# Patient Record
Sex: Male | Born: 1960
Health system: Southern US, Community
[De-identification: ages and names within clinical notes are randomized; demographics above are authoritative.]

## PROBLEM LIST (undated history)

## (undated) DIAGNOSIS — Z87442 Personal history of urinary calculi: Secondary | ICD-10-CM

## (undated) DIAGNOSIS — E119 Type 2 diabetes mellitus without complications: Secondary | ICD-10-CM

## (undated) HISTORY — PX: OTHER SURGICAL HISTORY: SHX169

## (undated) HISTORY — PX: KIDNEY STONE SURGERY: SHX686

## (undated) HISTORY — PX: TOOTH EXTRACTION: SUR596

---

## 1998-10-24 ENCOUNTER — Emergency Department (HOSPITAL_COMMUNITY): Admission: EM | Admit: 1998-10-24 | Discharge: 1998-10-24 | Payer: Self-pay | Admitting: Emergency Medicine

## 1998-10-24 ENCOUNTER — Encounter: Payer: Self-pay | Admitting: Emergency Medicine

## 1998-11-11 ENCOUNTER — Encounter: Admission: RE | Admit: 1998-11-11 | Discharge: 1998-11-11 | Payer: Self-pay | Admitting: Internal Medicine

## 1999-04-21 ENCOUNTER — Encounter: Admission: RE | Admit: 1999-04-21 | Discharge: 1999-04-21 | Payer: Self-pay | Admitting: Hematology and Oncology

## 1999-07-23 ENCOUNTER — Encounter: Admission: RE | Admit: 1999-07-23 | Discharge: 1999-07-23 | Payer: Self-pay | Admitting: Internal Medicine

## 1999-07-23 ENCOUNTER — Encounter: Payer: Self-pay | Admitting: Internal Medicine

## 1999-07-23 ENCOUNTER — Ambulatory Visit (HOSPITAL_COMMUNITY): Admission: RE | Admit: 1999-07-23 | Discharge: 1999-07-23 | Payer: Self-pay | Admitting: Internal Medicine

## 1999-09-08 ENCOUNTER — Encounter: Admission: RE | Admit: 1999-09-08 | Discharge: 1999-09-08 | Payer: Self-pay | Admitting: Hematology and Oncology

## 1999-09-11 ENCOUNTER — Encounter: Admission: RE | Admit: 1999-09-11 | Discharge: 1999-09-11 | Payer: Self-pay | Admitting: Internal Medicine

## 1999-09-11 ENCOUNTER — Encounter: Payer: Self-pay | Admitting: Internal Medicine

## 1999-09-11 ENCOUNTER — Ambulatory Visit (HOSPITAL_COMMUNITY): Admission: RE | Admit: 1999-09-11 | Discharge: 1999-09-11 | Payer: Self-pay | Admitting: Internal Medicine

## 1999-11-09 ENCOUNTER — Emergency Department (HOSPITAL_COMMUNITY): Admission: EM | Admit: 1999-11-09 | Discharge: 1999-11-09 | Payer: Self-pay | Admitting: Emergency Medicine

## 1999-11-10 ENCOUNTER — Encounter: Payer: Self-pay | Admitting: Emergency Medicine

## 2000-06-02 ENCOUNTER — Emergency Department (HOSPITAL_COMMUNITY): Admission: EM | Admit: 2000-06-02 | Discharge: 2000-06-02 | Payer: Self-pay | Admitting: Emergency Medicine

## 2000-06-02 ENCOUNTER — Encounter: Payer: Self-pay | Admitting: Emergency Medicine

## 2002-04-13 ENCOUNTER — Encounter: Admission: RE | Admit: 2002-04-13 | Discharge: 2002-04-13 | Payer: Self-pay | Admitting: Internal Medicine

## 2002-04-13 ENCOUNTER — Ambulatory Visit (HOSPITAL_COMMUNITY): Admission: RE | Admit: 2002-04-13 | Discharge: 2002-04-13 | Payer: Self-pay | Admitting: Internal Medicine

## 2002-04-27 ENCOUNTER — Encounter: Admission: RE | Admit: 2002-04-27 | Discharge: 2002-04-27 | Payer: Self-pay | Admitting: Internal Medicine

## 2002-06-21 ENCOUNTER — Emergency Department (HOSPITAL_COMMUNITY): Admission: EM | Admit: 2002-06-21 | Discharge: 2002-06-21 | Payer: Self-pay | Admitting: Emergency Medicine

## 2002-06-21 ENCOUNTER — Encounter: Payer: Self-pay | Admitting: Emergency Medicine

## 2002-11-05 ENCOUNTER — Inpatient Hospital Stay (HOSPITAL_COMMUNITY): Admission: AD | Admit: 2002-11-05 | Discharge: 2002-11-08 | Payer: Self-pay | Admitting: Internal Medicine

## 2002-11-05 ENCOUNTER — Encounter: Admission: RE | Admit: 2002-11-05 | Discharge: 2002-11-05 | Payer: Self-pay | Admitting: Internal Medicine

## 2002-11-07 ENCOUNTER — Encounter: Payer: Self-pay | Admitting: Cardiology

## 2003-03-26 ENCOUNTER — Encounter: Payer: Self-pay | Admitting: Emergency Medicine

## 2003-03-26 ENCOUNTER — Emergency Department (HOSPITAL_COMMUNITY): Admission: EM | Admit: 2003-03-26 | Discharge: 2003-03-26 | Payer: Self-pay | Admitting: Emergency Medicine

## 2003-07-08 ENCOUNTER — Emergency Department (HOSPITAL_COMMUNITY): Admission: EM | Admit: 2003-07-08 | Discharge: 2003-07-08 | Payer: Self-pay | Admitting: Emergency Medicine

## 2003-07-08 ENCOUNTER — Encounter: Payer: Self-pay | Admitting: Emergency Medicine

## 2003-07-15 ENCOUNTER — Encounter: Payer: Self-pay | Admitting: Urology

## 2003-07-15 ENCOUNTER — Ambulatory Visit (HOSPITAL_COMMUNITY): Admission: RE | Admit: 2003-07-15 | Discharge: 2003-07-15 | Payer: Self-pay | Admitting: Urology

## 2004-07-28 ENCOUNTER — Ambulatory Visit: Payer: Self-pay | Admitting: Family Medicine

## 2004-07-28 ENCOUNTER — Inpatient Hospital Stay (HOSPITAL_COMMUNITY): Admission: RE | Admit: 2004-07-28 | Discharge: 2004-07-29 | Payer: Self-pay | Admitting: Family Medicine

## 2005-02-23 ENCOUNTER — Ambulatory Visit: Payer: Self-pay | Admitting: Internal Medicine

## 2006-02-15 ENCOUNTER — Emergency Department (HOSPITAL_COMMUNITY): Admission: EM | Admit: 2006-02-15 | Discharge: 2006-02-15 | Payer: Self-pay | Admitting: Emergency Medicine

## 2007-02-28 ENCOUNTER — Ambulatory Visit: Payer: Self-pay | Admitting: Internal Medicine

## 2007-03-14 ENCOUNTER — Ambulatory Visit: Payer: Self-pay | Admitting: Internal Medicine

## 2007-03-15 ENCOUNTER — Ambulatory Visit: Payer: Self-pay | Admitting: *Deleted

## 2007-03-20 ENCOUNTER — Ambulatory Visit (HOSPITAL_COMMUNITY): Admission: RE | Admit: 2007-03-20 | Discharge: 2007-03-20 | Payer: Self-pay | Admitting: Internal Medicine

## 2007-09-14 ENCOUNTER — Ambulatory Visit: Payer: Self-pay | Admitting: Internal Medicine

## 2007-09-14 LAB — CONVERTED CEMR LAB
ALT: 57 units/L — ABNORMAL HIGH (ref 0–53)
AST: 37 units/L (ref 0–37)
Albumin: 4.1 g/dL (ref 3.5–5.2)
Alkaline Phosphatase: 78 units/L (ref 39–117)
BUN: 10 mg/dL (ref 6–23)
CO2: 22 meq/L (ref 19–32)
Calcium: 8.9 mg/dL (ref 8.4–10.5)
Chloride: 103 meq/L (ref 96–112)
Cholesterol: 168 mg/dL (ref 0–200)
Creatinine, Ser: 0.69 mg/dL (ref 0.40–1.50)
Glucose, Bld: 123 mg/dL — ABNORMAL HIGH (ref 70–99)
HDL: 30 mg/dL — ABNORMAL LOW (ref >39)
LDL Cholesterol: 80 mg/dL (ref 0–99)
Potassium: 4.6 meq/L (ref 3.5–5.3)
Sodium: 140 meq/L (ref 135–145)
TSH: 0.9 microintl units/mL (ref 0.350–5.50)
Total Bilirubin: 0.3 mg/dL (ref 0.3–1.2)
Total CHOL/HDL Ratio: 5.6
Total Protein: 7 g/dL (ref 6.0–8.3)
Triglycerides: 290 mg/dL — ABNORMAL HIGH (ref <150)
VLDL: 58 mg/dL — ABNORMAL HIGH (ref 0–40)

## 2007-10-12 ENCOUNTER — Ambulatory Visit: Payer: Self-pay | Admitting: Internal Medicine

## 2007-10-12 ENCOUNTER — Ambulatory Visit (HOSPITAL_BASED_OUTPATIENT_CLINIC_OR_DEPARTMENT_OTHER): Admission: RE | Admit: 2007-10-12 | Discharge: 2007-10-12 | Payer: Self-pay | Admitting: Internal Medicine

## 2007-10-15 ENCOUNTER — Ambulatory Visit: Payer: Self-pay | Admitting: Internal Medicine

## 2007-10-25 ENCOUNTER — Ambulatory Visit: Payer: Self-pay | Admitting: Internal Medicine

## 2009-08-06 ENCOUNTER — Ambulatory Visit: Payer: Self-pay | Admitting: Internal Medicine

## 2009-08-06 ENCOUNTER — Encounter: Payer: Self-pay | Admitting: Family Medicine

## 2009-08-06 LAB — CONVERTED CEMR LAB
Cholesterol: 168 mg/dL (ref 0–200)
HDL: 30 mg/dL — ABNORMAL LOW (ref >39)
LDL Cholesterol: 93 mg/dL (ref 0–99)
Total CHOL/HDL Ratio: 5.6
Triglycerides: 224 mg/dL — ABNORMAL HIGH (ref <150)
VLDL: 45 mg/dL — ABNORMAL HIGH (ref 0–40)

## 2009-08-15 ENCOUNTER — Ambulatory Visit: Payer: Self-pay | Admitting: Internal Medicine

## 2009-08-25 ENCOUNTER — Encounter: Payer: Self-pay | Admitting: Family Medicine

## 2009-08-25 ENCOUNTER — Ambulatory Visit: Payer: Self-pay | Admitting: Internal Medicine

## 2009-08-25 LAB — CONVERTED CEMR LAB
ALT: 21 units/L (ref 0–53)
AST: 15 units/L (ref 0–37)
Albumin: 4 g/dL (ref 3.5–5.2)
Alkaline Phosphatase: 89 units/L (ref 39–117)
BUN: 10 mg/dL (ref 6–23)
Basophils Absolute: 0.1 10*3/uL (ref 0.0–0.1)
Basophils Relative: 1 % (ref 0–1)
CO2: 23 meq/L (ref 19–32)
Calcium: 9.1 mg/dL (ref 8.4–10.5)
Chloride: 100 meq/L (ref 96–112)
Creatinine, Ser: 0.65 mg/dL (ref 0.40–1.50)
Eosinophils Absolute: 0.6 10*3/uL (ref 0.0–0.7)
Eosinophils Relative: 5 % (ref 0–5)
Glucose, Bld: 309 mg/dL — ABNORMAL HIGH (ref 70–99)
HCT: 46.7 % (ref 39.0–52.0)
Hemoglobin: 15.1 g/dL (ref 13.0–17.0)
Lymphocytes Relative: 31 % (ref 12–46)
Lymphs Abs: 3.3 10*3/uL (ref 0.7–4.0)
MCHC: 32.3 g/dL (ref 30.0–36.0)
MCV: 92.3 fL (ref 78.0–100.0)
Monocytes Absolute: 0.6 10*3/uL (ref 0.1–1.0)
Monocytes Relative: 6 % (ref 3–12)
Neutro Abs: 6.3 10*3/uL (ref 1.7–7.7)
Neutrophils Relative %: 58 % (ref 43–77)
PSA: 0.77 ng/mL (ref 0.10–4.00)
Platelets: 324 10*3/uL (ref 150–400)
Potassium: 4.6 meq/L (ref 3.5–5.3)
RBC: 5.06 M/uL (ref 4.22–5.81)
RDW: 12.5 % (ref 11.5–15.5)
Sodium: 136 meq/L (ref 135–145)
TSH: 0.482 microintl units/mL (ref 0.350–4.500)
Total Bilirubin: 0.5 mg/dL (ref 0.3–1.2)
Total Protein: 6.6 g/dL (ref 6.0–8.3)
WBC: 10.9 10*3/uL — ABNORMAL HIGH (ref 4.0–10.5)

## 2009-08-29 ENCOUNTER — Encounter: Payer: Self-pay | Admitting: Family Medicine

## 2009-08-29 LAB — CONVERTED CEMR LAB: Hgb A1c MFr Bld: 16.2 % — ABNORMAL HIGH (ref 4.6–6.1)

## 2009-09-24 ENCOUNTER — Ambulatory Visit: Payer: Self-pay | Admitting: Family Medicine

## 2009-10-21 ENCOUNTER — Ambulatory Visit: Payer: Self-pay | Admitting: Internal Medicine

## 2009-11-04 ENCOUNTER — Ambulatory Visit: Payer: Self-pay | Admitting: Internal Medicine

## 2009-11-04 LAB — CONVERTED CEMR LAB
BUN: 9 mg/dL (ref 6–23)
CO2: 21 meq/L (ref 19–32)
Calcium: 8.7 mg/dL (ref 8.4–10.5)
Chloride: 104 meq/L (ref 96–112)
Creatinine, Ser: 0.69 mg/dL (ref 0.40–1.50)
Glucose, Bld: 90 mg/dL (ref 70–99)
Potassium: 4.7 meq/L (ref 3.5–5.3)
Sodium: 140 meq/L (ref 135–145)

## 2009-12-17 ENCOUNTER — Ambulatory Visit: Payer: Self-pay | Admitting: Internal Medicine

## 2010-04-08 ENCOUNTER — Observation Stay (HOSPITAL_COMMUNITY): Admission: EM | Admit: 2010-04-08 | Discharge: 2010-04-09 | Payer: Self-pay | Admitting: Emergency Medicine

## 2010-04-08 ENCOUNTER — Emergency Department (HOSPITAL_COMMUNITY): Admission: EM | Admit: 2010-04-08 | Discharge: 2010-04-08 | Payer: Self-pay | Admitting: Family Medicine

## 2010-04-08 ENCOUNTER — Ambulatory Visit: Payer: Self-pay | Admitting: Cardiovascular Disease

## 2010-04-09 ENCOUNTER — Encounter (INDEPENDENT_AMBULATORY_CARE_PROVIDER_SITE_OTHER): Payer: Self-pay | Admitting: Emergency Medicine

## 2010-11-22 ENCOUNTER — Emergency Department (HOSPITAL_COMMUNITY)
Admission: EM | Admit: 2010-11-22 | Discharge: 2010-11-22 | Payer: Self-pay | Source: Home / Self Care | Admitting: Emergency Medicine

## 2010-11-23 ENCOUNTER — Encounter (INDEPENDENT_AMBULATORY_CARE_PROVIDER_SITE_OTHER): Payer: Self-pay | Admitting: *Deleted

## 2010-11-24 ENCOUNTER — Inpatient Hospital Stay (HOSPITAL_COMMUNITY): Admission: EM | Admit: 2010-11-24 | Discharge: 2010-11-26 | Payer: Self-pay | Source: Home / Self Care

## 2010-11-24 ENCOUNTER — Emergency Department (HOSPITAL_COMMUNITY)
Admission: EM | Admit: 2010-11-24 | Discharge: 2010-11-24 | Disposition: A | Discharge status: Other Acute Inpatient Hospital | Payer: Self-pay | Source: Home / Self Care | Admitting: Family Medicine

## 2010-11-24 LAB — CBC
HCT: 34.8 % — ABNORMAL LOW (ref 39.0–52.0)
Hemoglobin: 11.1 g/dL — ABNORMAL LOW (ref 13.0–17.0)
MCH: 28.2 pg (ref 26.0–34.0)
MCHC: 31.9 g/dL (ref 30.0–36.0)
MCV: 88.5 fL (ref 78.0–100.0)
Platelets: 421 10*3/uL — ABNORMAL HIGH (ref 150–400)
RBC: 3.93 MIL/uL — ABNORMAL LOW (ref 4.22–5.81)
RDW: 14.1 % (ref 11.5–15.5)
WBC: 12.3 10*3/uL — ABNORMAL HIGH (ref 4.0–10.5)

## 2010-11-24 LAB — HEPATIC FUNCTION PANEL
ALT: 18 U/L (ref 0–53)
AST: 17 U/L (ref 0–37)
Albumin: 3.7 g/dL (ref 3.5–5.2)
Alkaline Phosphatase: 68 U/L (ref 39–117)
Bilirubin, Direct: <0.1 mg/dL (ref 0.0–0.3)
Total Bilirubin: 0.3 mg/dL (ref 0.3–1.2)
Total Protein: 7.2 g/dL (ref 6.0–8.3)

## 2010-11-24 LAB — POCT I-STAT, CHEM 8
BUN: 9 mg/dL (ref 6–23)
Calcium, Ion: 1.14 mmol/L (ref 1.12–1.32)
Chloride: 102 mEq/L (ref 96–112)
Creatinine, Ser: 0.9 mg/dL (ref 0.4–1.5)
Glucose, Bld: 103 mg/dL — ABNORMAL HIGH (ref 70–99)
HCT: 38 % — ABNORMAL LOW (ref 39.0–52.0)
Hemoglobin: 12.9 g/dL — ABNORMAL LOW (ref 13.0–17.0)
Potassium: 4 mEq/L (ref 3.5–5.1)
Sodium: 136 mEq/L (ref 135–145)
TCO2: 28 mmol/L (ref 0–100)

## 2010-11-24 LAB — HEMOCCULT GUIAC POC 1CARD (OFFICE): Fecal Occult Bld: POSITIVE

## 2010-11-24 LAB — LIPASE, BLOOD: Lipase: 28 U/L (ref 11–59)

## 2010-11-25 ENCOUNTER — Encounter: Payer: Self-pay | Admitting: Gastroenterology

## 2010-11-25 LAB — COMPREHENSIVE METABOLIC PANEL
ALT: 16 U/L (ref 0–53)
AST: 16 U/L (ref 0–37)
Albumin: 3.3 g/dL — ABNORMAL LOW (ref 3.5–5.2)
Alkaline Phosphatase: 58 U/L (ref 39–117)
BUN: 11 mg/dL (ref 6–23)
CO2: 26 mEq/L (ref 19–32)
Calcium: 9 mg/dL (ref 8.4–10.5)
Chloride: 102 mEq/L (ref 96–112)
Creatinine, Ser: 0.8 mg/dL (ref 0.4–1.5)
GFR calc Af Amer: >60 mL/min (ref >60)
GFR calc non Af Amer: >60 mL/min (ref >60)
Glucose, Bld: 101 mg/dL — ABNORMAL HIGH (ref 70–99)
Potassium: 4.3 mEq/L (ref 3.5–5.1)
Sodium: 136 mEq/L (ref 135–145)
Total Bilirubin: 0.2 mg/dL — ABNORMAL LOW (ref 0.3–1.2)
Total Protein: 6.5 g/dL (ref 6.0–8.3)

## 2010-11-25 LAB — CBC
HCT: 29.9 % — ABNORMAL LOW (ref 39.0–52.0)
HCT: 31.1 % — ABNORMAL LOW (ref 39.0–52.0)
Hemoglobin: 9.9 g/dL — ABNORMAL LOW (ref 13.0–17.0)
Hemoglobin: 9.9 g/dL — ABNORMAL LOW (ref 13.0–17.0)
MCH: 28.4 pg (ref 26.0–34.0)
MCH: 28 pg (ref 26.0–34.0)
MCHC: 33.1 g/dL (ref 30.0–36.0)
MCHC: 31.8 g/dL (ref 30.0–36.0)
MCV: 85.9 fL (ref 78.0–100.0)
MCV: 87.9 fL (ref 78.0–100.0)
Platelets: 403 10*3/uL — ABNORMAL HIGH (ref 150–400)
Platelets: 407 10*3/uL — ABNORMAL HIGH (ref 150–400)
RBC: 3.48 MIL/uL — ABNORMAL LOW (ref 4.22–5.81)
RBC: 3.54 MIL/uL — ABNORMAL LOW (ref 4.22–5.81)
RDW: 14 % (ref 11.5–15.5)
RDW: 14 % (ref 11.5–15.5)
WBC: 10.2 10*3/uL (ref 4.0–10.5)
WBC: 7.7 10*3/uL (ref 4.0–10.5)

## 2010-11-25 LAB — HEMOGLOBIN A1C
Hgb A1c MFr Bld: 5.8 % — ABNORMAL HIGH (ref <5.7)
Mean Plasma Glucose: 120 mg/dL — ABNORMAL HIGH (ref <117)

## 2010-11-25 LAB — DIFFERENTIAL
Basophils Absolute: 0.1 10*3/uL (ref 0.0–0.1)
Basophils Relative: 1 % (ref 0–1)
Eosinophils Absolute: 0.3 10*3/uL (ref 0.0–0.7)
Eosinophils Relative: 2 % (ref 0–5)
Lymphocytes Relative: 28 % (ref 12–46)
Lymphs Abs: 2.8 10*3/uL (ref 0.7–4.0)
Monocytes Absolute: 0.7 10*3/uL (ref 0.1–1.0)
Monocytes Relative: 7 % (ref 3–12)
Neutro Abs: 6.4 10*3/uL (ref 1.7–7.7)
Neutrophils Relative %: 63 % (ref 43–77)

## 2010-11-25 LAB — PROTIME-INR
INR: 0.96 (ref 0.00–1.49)
Prothrombin Time: 13 seconds (ref 11.6–15.2)

## 2010-11-25 LAB — LIPID PANEL
Cholesterol: 143 mg/dL (ref 0–200)
HDL: 29 mg/dL — ABNORMAL LOW (ref >39)
LDL Cholesterol: 90 mg/dL (ref 0–99)
Total CHOL/HDL Ratio: 4.9 RATIO
Triglycerides: 122 mg/dL (ref <150)
VLDL: 24 mg/dL (ref 0–40)

## 2010-11-25 LAB — GLUCOSE, CAPILLARY
Glucose-Capillary: 107 mg/dL — ABNORMAL HIGH (ref 70–99)
Glucose-Capillary: 106 mg/dL — ABNORMAL HIGH (ref 70–99)
Glucose-Capillary: 123 mg/dL — ABNORMAL HIGH (ref 70–99)
Glucose-Capillary: 92 mg/dL (ref 70–99)
Glucose-Capillary: 99 mg/dL (ref 70–99)
Glucose-Capillary: 96 mg/dL (ref 70–99)
Glucose-Capillary: 120 mg/dL — ABNORMAL HIGH (ref 70–99)

## 2010-11-25 LAB — HEMOGLOBIN AND HEMATOCRIT, BLOOD
HCT: 29.9 % — ABNORMAL LOW (ref 39.0–52.0)
HCT: 33.2 % — ABNORMAL LOW (ref 39.0–52.0)
Hemoglobin: 9.9 g/dL — ABNORMAL LOW (ref 13.0–17.0)
Hemoglobin: 10.5 g/dL — ABNORMAL LOW (ref 13.0–17.0)

## 2010-11-25 LAB — APTT: aPTT: 33 seconds (ref 24–37)

## 2010-11-25 LAB — LIPASE, BLOOD: Lipase: 27 U/L (ref 11–59)

## 2010-11-25 LAB — ABO/RH: ABO/RH(D): O POS

## 2010-11-26 LAB — CBC
HCT: 33 % — ABNORMAL LOW (ref 39.0–52.0)
Hemoglobin: 10.5 g/dL — ABNORMAL LOW (ref 13.0–17.0)
MCH: 27.9 pg (ref 26.0–34.0)
MCHC: 31.8 g/dL (ref 30.0–36.0)
MCV: 87.8 fL (ref 78.0–100.0)
Platelets: 431 10*3/uL — ABNORMAL HIGH (ref 150–400)
RBC: 3.76 MIL/uL — ABNORMAL LOW (ref 4.22–5.81)
RDW: 14 % (ref 11.5–15.5)
WBC: 8 10*3/uL (ref 4.0–10.5)

## 2010-11-26 LAB — GLUCOSE, CAPILLARY
Glucose-Capillary: 107 mg/dL — ABNORMAL HIGH (ref 70–99)
Glucose-Capillary: 109 mg/dL — ABNORMAL HIGH (ref 70–99)

## 2010-11-26 LAB — CLOTEST (H. PYLORI), BIOPSY: Helicobacter screen: POSITIVE — AB

## 2010-11-27 LAB — CROSSMATCH
ABO/RH(D): O POS
Antibody Screen: NEGATIVE
Unit division: 0
Unit division: 0

## 2010-11-28 NOTE — H&P (Signed)
Allen Lowery, Allen Lowery NO.:  [id number]  MEDICAL RECORD NO.:  [id number]          PATIENT TYPE:  EMS  LOCATION:  MAJO                         FACILITY:  [facility]  PHYSICIAN:  Homero Fellers, MD   DATE OF BIRTH:  July 18, 1961  DATE OF ADMISSION:  11/24/2010 DATE OF DISCHARGE:                             HISTORY & PHYSICAL   PRIMARY CARE PHYSICIAN:  None.  CHIEF COMPLAINT:  Epigastric pain, melena stool.  HISTORY OF PRESENT ILLNESS:  A 50 year old Hispanic-speaking gentleman who also speaks some English, presented with melena stool for the past 2 weeks which stopped 2 days ago accompanied with epigastric pain of similar duration.  The patient has no prior history of GI bleed.  He also denies any hematemesis, coffee-ground emesis or vomiting.  He came to the emergency room 2 days ago and was given some Prilosec and Carafate which has not helped this gastric pain much according to the patient.  His hemoglobin 2 days ago was 12.9 but today is 9.9 (down by 3 points).  The patient denies the use of nonsteroidal antiinflammatory agents on a chronic basis.  He is also not on any other from of  blood thinners.  He was diagnosed with diabetes mellitus a year ago and was given some medication but is currently not on any medicine at this time. His glucose approximately is 101 today.  The patient denies any chest pain, shortness of breath, fever, diarrhea or constipation.  No leg swelling.  No urinary symptoms.  PAST MEDICAL HISTORY:  Diabetes, kidney stone.  MEDICATION:  Prilosec and Carafate prescribed 2 days ago.  ALLERGIES:  None.  SOCIAL HISTORY:  No smoking, alcohol or drugs.  The patient lives with wife and children who works in a [employer].  REVIEW OF SYSTEMS:  Ten-point review is negative except as above.  PHYSICAL EXAMINATION:  VITAL SIGNS:  Blood pressure 119/73, pulse 82, respirations 20, temperature 98.1.  O2 99% on room air. GENERAL:  A pleasant  50 year old Hispanic man in no respiratory distress. HEENT:  Pallor.  He is not pale, anicteric. NECK:  Supple.  No JVD, adenopathy, or thyromegaly. LUNGS:  Clear breath sounds bilaterally to auscultation, no wheezing, no crackles. HEART:  S1, S2, no murmurs, rubs, or gallops. ABDOMEN:  Full, soft.  Vague epigastric tenderness.  Bowel sounds present.  No masses. EXTREMITIES:  No edema, clubbing, or cyanosis. NEUROLOGIC:  No focal deficits.  Speech is normal.  Coordination is intact. SKIN:  No rash or lesion.  LABORATORY:  Again, hemoglobin today 9.9 from 12.9 two days ago.  White count 10.2, platelet count is 403.  Coagulation profile is not available yet.  Chemistry, potassium is 4.3, sodium 136, BUN 11, creatinine 2.8. Liver enzymes normal.  Lipase normal.  CT of the abdomen and pelvis with contrast was done which showed no evidence of kidney stones, sigmoid diverticulosis with no acute abnormalities found.  Of note, the testicle, medium density fluid is noted in the inferior right inguinal canal.  ASSESSMENT:  A 50 year old Hispanic man admitted with: 1. Gastrointestinal bleed which is likely upper going by symptoms and     drop in hemoglobin.  Please also note  that stool for occult blood     in the emergency room was positive. 2. Diabetes mellitus which appears to be diet controlled. 3. Obesity. 4. History of kidney stone. 5. Anemia secondary to acute blood loss.  PLAN:  To admit to telemetry floor.  Since that the patient is not appear to be bleeding at this time,  I will put him on clear liquids and n.p.o. post midnight.  GI should be consulted in the morning.  The patient will benefit from at least an upper endoscopy on this admission.  I will do serial hemoglobin for the next 24 hours.  Give Protonix 40 mg IV q.12. Hemoglobin A1c will be requested.  His condition overall is stable.     Homero Fellers, MD     FA/MEDQ  D:  11/24/2010  T:  11/24/2010  Job:   [id number]  Electronically Signed by Homero Fellers  on 11/28/2010 12:37:11 PM

## 2010-12-03 NOTE — Procedures (Signed)
Summary: Upper Endoscopy  Patient: Allen Lowery Note: All result statuses are Final unless otherwise noted.  Tests: (1) Upper Endoscopy (EGD)   EGD Upper Endoscopy       DONE     [facility]     [address]     [place], [place]  [address]           ENDOSCOPY PROCEDURE REPORT           PATIENT:  Allen, Lowery  MR#:  [id number]     BIRTHDATE:  [birth year], 49 yrs. old  GENDER:  male     ENDOSCOPIST:  Rachael Fee, MD     PROCEDURE DATE:  11/25/2010     PROCEDURE:  EGD with biopsy, 43239     ASA CLASS:  Class II     INDICATIONS:  recent melena, anemia, abd pain     MEDICATIONS:   Fentanyl 50 mcg IV, Versed 5 mg IV     TOPICAL ANESTHETIC:  Cetacaine Spray           DESCRIPTION OF PROCEDURE:   After the risks benefits and     alternatives of the procedure were thoroughly explained, informed     consent was obtained.  The EG-[id number] ([id number]) endoscope was     introduced through the mouth and advanced to the second portion of     the duodenum, without limitations.  The instrument was slowly     withdrawn as the mucosa was fully examined.     <<PROCEDUREIMAGES>>     There was a 2cm, crateredd but clean based, non-bleeding ulcer at     incisure. There was moderate, pan-gastritis. Biopsies were taken     from distal stomach (sent to path and for CLO testing) (see     image005 and image004).  Otherwise the examination was normal (see     image003 and image002).    Retroflexed views revealed no     abnormalities.    The scope was then withdrawn from the patient     and the procedure completed.     COMPLICATIONS:  None           ENDOSCOPIC IMPRESSION:     1) 2cm clean based, non-bleeding ulcer in stomach.   Also     pan-gastritis.  Biopsies taken from stomach to check for H. pylori           2) Otherwise normal examination           RECOMMENDATIONS:     He should stay on PPI twice daily for 6 weeks, then OK to back     down to once daily.      If biopsies show H.pylori, he will be started on the appropriate     antibiotics.     Dr. Christella Hartigan' office will call him to set up repeat EGD in 2     months to confirm ulcer resolution.     He should avoid NSAIDs as best as possible.           REPEAT EXAM:  2 months           ______________________________     Rachael Fee, MD           n.     eSIGNED:   Rachael Fee at 11/25/2010 11:30 AM           Esmeralda Arthur, [id number]  Note: An exclamation mark (!) indicates a result that  was not dispersed into the flowsheet. Document Creation Date: 11/25/2010 11:32 AM _______________________________________________________________________  (1) Order result status: Final Collection or observation date-time: 11/25/2010 11:24 Requested date-time:  Receipt date-time:  Reported date-time:  Referring Physician:   Ordering Physician: Rob Bunting [phone]) Specimen Source:  Source: Launa Grill Order Number: [id number] Lab site:   Appended Document: Upper Endoscopy patty, he needs EGD with me in 2 months [facility]  Appended Document: Upper Endoscopy will be added to EPIC when available  Appended Document: Upper Endoscopy recall in EPIC

## 2010-12-03 NOTE — Letter (Signed)
Summary: New Patient letter  [facility]  [address]   [place], [place] [address]   Phone: [phone]  Fax: [phone]       11/23/2010 MRN: [id number]  Allen Lowery [address] [place], [place]  [address]  Dear Allen Lowery,  Welcome to the Gastroenterology Division at [facility].    You are scheduled to see Dr.  Jarold Motto on 12/25/2010 at 9:00am on the 3rd floor at [facility], [address]  We ask that you try to arrive at our office 15 minutes prior to your appointment time to allow for check-in.  We would like you to complete the enclosed self-administered evaluation form prior to your visit and bring it with you on the day of your appointment.  We will review it with you.  Also, please bring a complete list of all your medications or, if you prefer, bring the medication bottles and we will list them.  Please bring your insurance card so that we may make a copy of it.  If your insurance requires a referral to see a specialist, please bring your referral form from your primary care physician.  Co-payments are due at the time of your visit and may be paid by cash, check or credit card.     Your office visit will consist of a consult with your physician (includes a physical exam), any laboratory testing he/she may order, scheduling of any necessary diagnostic testing (e.g. x-ray, ultrasound, CT-scan), and scheduling of a procedure (e.g. Endoscopy, Colonoscopy) if required.  Please allow enough time on your schedule to allow for any/all of these possibilities.    If you cannot keep your appointment, please call [phone] to cancel or reschedule prior to your appointment date.  This allows [place] the opportunity to schedule an appointment for another patient in need of care.  If you do not cancel or reschedule by 5 p.m. the business day prior to your appointment date, you will be charged a $50.00 late cancellation/no-show fee.     Thank you for choosing [facility] for your medical needs.  We appreciate the opportunity to care for you.  Please visit [place] at our website  to learn more about our practice.                     Sincerely,                                                             The Gastroenterology Division

## 2010-12-11 NOTE — Discharge Summary (Signed)
Allen Lowery, Allen Lowery       ACCOUNT NO.:  [id number]  MEDICAL RECORD NO.:  [id number]          PATIENT TYPE:  INP  LOCATION:  3738                         FACILITY:  [facility]  PHYSICIAN:  Ruthy Dick, MD    DATE OF BIRTH:  [birth year]  DATE OF ADMISSION:  11/24/2010 DATE OF DISCHARGE:  11/26/2010                              DISCHARGE SUMMARY   REASON FOR ADMISSION:  Abdominal epigastric pain.  PRIMARY CARE PHYSICIAN:  Unassigned.  GASTROENTEROLOGIST:  Rachael Fee, MD, who just saw the patient during this admission for the first time.  FINAL DISCHARGE DIAGNOSES: 1. Abdominal pain, likely secondary to peptic ulcer disease. 2. Upper gastrointestinal bleed again secondary to peptic ulcer     disease. 3. Gastric ulcers. 4. Acute blood loss anemia, secondary to upper gastrointestinal bleed. 5. Obstructive sleep apnea. 6. Obesity.  BRIEF HISTORY OF PRESENT ILLNESS AND HOSPITAL COURSE:  This 50 year old Hispanic gentleman with past medical history significant for kidney stones who presented to the hospital with epigastric pain.  He also had some melanotic stools.  He was also noted to have a drop in his hemoglobin from 12.9 to 9.9 in about 3 days.  He was admitted for this reason and an EGD was done.  The EGD showed two clean-based ulcers and no active bleeding at that time.  He was restarted on his regular diet which he seems to have kept down and done very well.  His abdominal pain has resolved with proton pump inhibitors.  Recommendations by the gastroenterologist is that the patient should be on twice daily proton pump inhibitors for about 6 weeks and after that can switch back to once daily proton pump inhibitors.  He is also to avoid using NSAIDS.  Again, he has no complaints today, no nausea, no vomiting, no diarrhea, no abdominal pain, no dysuria, no frequency.  PHYSICAL EXAMINATION:  VITAL SIGNS:  Temperature is 98.5, pulse 65, respirations 18, blood  pressure 122/70, and saturating 99% on room air. CHEST:  Clear to auscultation bilaterally.  No rhonchi, no rales, no wheezing. ABDOMEN:  Soft, nontender. EXTREMITIES:  No clubbing, no cyanosis, no edema. CARDIOVASCULAR:  First and second heart sounds only. CENTRAL NERVOUS SYSTEM:  Nonfocal.  DISCHARGE MEDICATIONS: 1. Omeprazole 40 mg twice daily for 42 days, then daily. 2. Sucralfate 1 g daily at bedtime The patient is to stop taking Goody's Powders and is to stop taking nonsteroidal anti-inflammatory medications like Motrin.  The patient is to follow up with Dr. Rob Bunting in about 2 months or as scheduled by Dr. Christella Hartigan' office.  H. pylori biopsy was taken during EGD and depending on the results of this H. pylori, the patient will be called by Dr. Christella Hartigan' office for further treatment if this is not found to be positive.  The patient is also advised to get a CPAP for his obstructive sleep apnea as recommended by the sleep study.  Total time used for discharging the patient is 40 minutes.  Of note is the fact that the patient did not have any transition during this admission, and his hemoglobin and hematocrit have been stable.  Plan of care was discussed with the patient at  length and is in agreement and plans to comply.     Ruthy Dick, MD     GU/MEDQ  D:  11/26/2010  T:  11/26/2010  Job:  [id number]  Electronically Signed by Virginia Rochester M.D. on 12/11/2010 11:28:28 AM

## 2010-12-25 ENCOUNTER — Ambulatory Visit: Payer: Self-pay | Admitting: Gastroenterology

## 2011-01-18 LAB — COMPREHENSIVE METABOLIC PANEL
ALT: 18 U/L (ref 0–53)
AST: 22 U/L (ref 0–37)
Albumin: 3.7 g/dL (ref 3.5–5.2)
Alkaline Phosphatase: 63 U/L (ref 39–117)
BUN: 10 mg/dL (ref 6–23)
CO2: 25 mEq/L (ref 19–32)
Calcium: 8.7 mg/dL (ref 8.4–10.5)
Chloride: 108 mEq/L (ref 96–112)
Creatinine, Ser: 0.7 mg/dL (ref 0.4–1.5)
GFR calc Af Amer: >60 mL/min (ref >60)
GFR calc non Af Amer: >60 mL/min (ref >60)
Glucose, Bld: 85 mg/dL (ref 70–99)
Potassium: 4.2 mEq/L (ref 3.5–5.1)
Sodium: 139 mEq/L (ref 135–145)
Total Bilirubin: 0.8 mg/dL (ref 0.3–1.2)
Total Protein: 6.8 g/dL (ref 6.0–8.3)

## 2011-01-18 LAB — CBC
HCT: 41.6 % (ref 39.0–52.0)
Hemoglobin: 13.9 g/dL (ref 13.0–17.0)
MCHC: 33.5 g/dL (ref 30.0–36.0)
MCV: 89.5 fL (ref 78.0–100.0)
Platelets: 283 10*3/uL (ref 150–400)
RBC: 4.65 MIL/uL (ref 4.22–5.81)
RDW: 14.7 % (ref 11.5–15.5)
WBC: 11.3 10*3/uL — ABNORMAL HIGH (ref 4.0–10.5)

## 2011-01-18 LAB — CK TOTAL AND CKMB (NOT AT ARMC)
CK, MB: 1.7 ng/mL (ref 0.3–4.0)
CK, MB: 1.9 ng/mL (ref 0.3–4.0)
CK, MB: 1.9 ng/mL (ref 0.3–4.0)
CK, MB: 2.1 ng/mL (ref 0.3–4.0)
Relative Index: 0.9 (ref 0.0–2.5)
Relative Index: 1.2 (ref 0.0–2.5)
Relative Index: 1.3 (ref 0.0–2.5)
Relative Index: 1.3 (ref 0.0–2.5)
Total CK: 132 U/L (ref 7–232)
Total CK: 150 U/L (ref 7–232)
Total CK: 181 U/L (ref 7–232)
Total CK: 216 U/L (ref 7–232)

## 2011-01-18 LAB — DIFFERENTIAL
Basophils Absolute: 0.1 10*3/uL (ref 0.0–0.1)
Basophils Relative: 1 % (ref 0–1)
Eosinophils Absolute: 0.5 10*3/uL (ref 0.0–0.7)
Eosinophils Relative: 4 % (ref 0–5)
Lymphocytes Relative: 30 % (ref 12–46)
Lymphs Abs: 3.4 10*3/uL (ref 0.7–4.0)
Monocytes Absolute: 0.6 10*3/uL (ref 0.1–1.0)
Monocytes Relative: 6 % (ref 3–12)
Neutro Abs: 6.6 10*3/uL (ref 1.7–7.7)
Neutrophils Relative %: 59 % (ref 43–77)

## 2011-01-18 LAB — TROPONIN I
Troponin I: 0.03 ng/mL (ref 0.00–0.06)
Troponin I: <0.01 ng/mL (ref 0.00–0.06)
Troponin I: <0.01 ng/mL (ref 0.00–0.06)
Troponin I: <0.01 ng/mL (ref 0.00–0.06)

## 2011-01-18 LAB — PROTIME-INR
INR: 0.97 (ref 0.00–1.49)
Prothrombin Time: 12.8 seconds (ref 11.6–15.2)

## 2011-03-16 NOTE — Procedures (Signed)
NAME:  Allen Lowery, Allen Lowery       ACCOUNT NO.:  [id number]   MEDICAL RECORD NO.:  [id number]          PATIENT TYPE:  OUT   LOCATION:  SLEEP CENTER                 FACILITY:  [facility]   PHYSICIAN:  Clinton D. Maple Hudson, MD, FCCP, FACPDATE OF BIRTH:  [birth year]   DATE OF STUDY:  10/12/2007                            NOCTURNAL POLYSOMNOGRAM   REFERRING PHYSICIAN:  Dineen Kid. Reche Dixon, M.D.   INDICATION FOR STUDY:  Hypersomnia with sleep apnea.   EPWORTH SLEEPINESS SCORE:  24/24, BMI 47.8, weight 296 pounds, height 66  inches.  The patient does not speak [place].   SLEEP ARCHITECTURE:  Split study protocol.  During the diagnostic phase,  total sleep time was 136 minutes with sleep efficiency 87.8 percent.  Stage 1 was 4 percent, stage 2 96% of total sleep time.  Stages 3 and  REM were absent.  Sleep latency 0.5 minutes, awake after sleep onset 12  minutes, arousal index 44.8 indicating increased EEG arousal   RESPIRATORY DATA:  Split study protocol.  Apnea/hypopnea index (AHI)  129.7 obstructive events per hour, indicating severe obstructive sleep  apnea/hypopnea syndrome.  There were 252 obstructive apneas and 43  hypopneas.  All events were while supine.  CPAP was titrated to 19 CWP,  AHI 0.7 per hour.  A mirage Quattro mask was used with heated  humidifier.  Technician did not specify mask size.   OXYGEN DATA:  Moderate snoring with oxygen desaturation, to a nadir of  31% before CPAP.  After CPAP titration, mean oxygen saturation held 91%  on room air.   CARDIAC DATA:  Normal sinus rhythm.   MOVEMENT/PARASOMNIA:  No significant movement disturbance, until CPAP  was fitted, then leg movements with arousal averaged 16.1 per hour.   IMPRESSIONS-RECOMMENDATIONS:  1. Severe obstructive sleep apnea/hypopnea syndrome, AHI 129.7 per      hour.  Sleep and events were mainly supine.  Moderate snoring with      oxygen desaturation to a nadir of 31% before CPAP.  2. Successful CPAP titration to 19  CWP, AHI 0.7 per hour.  A Mirage      Quattro full face mask was used with heated humidifier.  The      technician did not specify mask size, which can be adjusted by home      care.      Clinton D. Maple Hudson, MD, [facility], FACP  Diplomate, Biomedical engineer of Sleep Medicine  Electronically Signed     CDY/MEDQ  D:  10/15/2007 11:31:34  T:  10/16/2007 11:48:02  Job:  [id number]

## 2011-03-19 NOTE — Consult Note (Signed)
Allen Lowery, CREAN NO.:  [id number]   MEDICAL RECORD NO.:  [id number]                   PATIENT TYPE:  INP   LOCATION:  3743                                 FACILITY:  [facility]   PHYSICIAN:  Charlton Haws, M.D. [facility]              DATE OF BIRTH:  1961-10-20   DATE OF CONSULTATION:  11/05/2002  DATE OF DISCHARGE:                                   CONSULTATION   REASON FOR CONSULTATION:  Chest pain.   HISTORY OF PRESENT ILLNESS:  The patient is a 50 year old non-English-  Speaking [place] patient admitted by the family practice service today from  clinic.  He has no previously documented coronary artery disease.  He would  appear to have risk factors of positive family history and untreated  hypertension.  He is a sedentary and obese Corporate investment banker.  [place] and worked Holiday representative for five years.  He has  a longstanding history of over five years of atypical chest pain, mostly  chest wall pain radiating to both shoulders and his neck, occasionally in  his jaw area and is not always exertional.  He does get exertional dyspnea  at work, but this has not changed recently.  The history is limited.  Actually since the patient does not speak [place], I primarily spoke with  his eldest son.  It would appear that the chest pain may be coming on more  frequently and at rest now as opposed to only with exertion in the past.  The patient is a nonsmoker and does not carry a documented history of DVT,  PE, or asthma, and there is no significant family or congenital heart  disease.   REVIEW OF SYSTEMS:  Remarkable for some arthritis in his hands and knees.  There is no bleeding diaphysis.  He has not had any significant trauma to  the chest in the past.   ALLERGIES:  No known drug allergies.   MEDICATIONS:  He takes no regular medications.   PHYSICAL EXAMINATION:  GENERAL:  An obese [place] male in no distress.  VITAL SIGNS:   Blood pressure 150/88, pulse 88 and regular.  NECK:  Carotids are normal.  LUNGS:  Clear.  CARDIAC:  There is an S1 and S2 with normal heart sounds.  ABDOMEN:  Benign.  EXTREMITIES:  Distal pulses are intact with no edema.   LABORATORY DATA:  EKG is normal.   Labs and chest x-ray are pending.   IMPRESSION:  Atypical chest pain in a 50 year old [place] male.  He has a  normal EKG.  I think the easiest thing to do at this point would be to do an  exercise stress Cardiolite study and a 2-D echocardiogram to rule out  ischemic constructural heart disease.   PLAN:  1. I will need to review his chest x-ray when it is done to see if there are  any other mediastinal masses.  2. The patient is self pay and may need medical assistance to afford blood     pressure medications.  3. I think Altace 5 mg a day would be reasonable.  4. He would also need a dietary consult as he has hypertriglyceridemia.  His     LDL cholesterol from his clinic report seems to be under 100.  5. Further recommendations will be based on the results of his     echocardiogram and stress test.                                               Charlton Haws, M.D. [facility]    PN/MEDQ  D:  11/05/2002  T:  11/05/2002  Job:  [id number]

## 2011-03-19 NOTE — Discharge Summary (Signed)
NAME:  Allen Lowery, Allen Lowery NO.:  [id number]   MEDICAL RECORD NO.:  [id number]                   PATIENT TYPE:  INP   LOCATION:  3743                                 FACILITY:  [facility]   PHYSICIAN:  Catalina Pizza, M.D.                     DATE OF BIRTH:  [birth year]   DATE OF ADMISSION:  11/05/2002  DATE OF DISCHARGE:  11/08/2002                                 DISCHARGE SUMMARY   DISCHARGE DIAGNOSES:  1. Atypical chest pain.  2. Hypertriglyceridemia.  3. Headache.  4. Dyspnea on exertion.   DISCHARGE MEDICATIONS:  1. Enalapril 5 mg p.o. daily.  2. Aspirin 81 mg p.o. daily.   CHIEF COMPLAINT:  1. Chest pain.  2. Dyspnea on exertion.   HISTORY OF PRESENT ILLNESS:  A 50 year old Hispanic male with no past  medical history who presented from clinic with a 5-year history of chest  pain symptoms, substernal, dull.  No radiation.  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  He did have pain in his tongue and hands bilaterally,  but it does not occur in relation to this chest pain.  He does occasionally  have diaphoresis and dyspnea while working and at rest.  The pain is  approximately 5/10 and lasts approximately 10 minutes.   ALLERGIES:  No known drug allergies.   MEDICATIONS UPON ADMISSION:  Only takes occasional BC powder.   SUBSTANCE HISTORY:  Never smokes.  Does not drink alcohol.  No drug use.   SOCIAL HISTORY:  Married.  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  Lives in [place].  Has five kids.   FAMILY HISTORY:  Only significant for diabetes in his mother.   PHYSICAL EXAMINATION:  VITAL SIGNS:  On admission, blood pressure was  156/90, pulse 98, temperature 98.0, respirations 18, O2 sat 91% on room air.  GENERAL:  Physical exam was benign  for everything, other than trace edema  bilateral lower extremities.   LABORATORIES ON ADMISSION:  Only significant for EKG which showed a sinus  rhythm at 91, PR interval of 152, QRS of 82, QTC of 428, inverted T wave in  V1.  No change from prior EKG.  All other lab work was within normal range.  During hospitalization, the patient's last values were a white blood cell  count of 8.1, hemoglobin 14.3, platelets 279.  Sodium 140, potassium 3.9,  chloride 106, CO2 26, glucose 99, BUN 9, creatinine 0.8, calcium 8.8.  AST  24, ALT 36, alkaline phosphatase 76, total bilirubin of 0.4.  CK 214, 171,  146 respectively.  CK-MB at 3.4, 2.9, 2.6 respectively.  Troponin I  0.01,  0.04, 0.01 respectively.  Lipid profile showed a total cholesterol of 166,  triglycerides of 169, HDL 39, LDL 95, TSH 1.251.   STUDIES OBTAINED DURING HOSPITALIZATION:  Chest x-ray showed no active  disease.  Low lung volumes were seen.  Heart size was within normal limits.  A Cardiolite study showed no evidence of left ventricular myocardial  ischemia or scar.  Left ventricular ejection fraction was calculated at 59%.  The 2-D echo results show LV EF of 60%.  The left atrium was mildly dilated.  LV function appeared to be normal.  No other abnormalities noted.   CONSULTATIONS:  Consultation by Dr. Charlton Haws suggested obtaining a  Cardiolite and 2-D echo to rule out ischemic structural heart disease, and  also to lower his triglyceride level and LDL to below 100.   HOSPITAL COURSE:  1. Atypical chest pain.  During hospitalization, did obtain a consult with     cardiology.  Seen by Dr. Charlton Haws and evaluated with a 2-D echo, as     well as nuclear medicine study, which both showed no apparent ischemia.     During hospitalization, was started on a beta blocker which apparently     caused a transient 2:1 AV block.  The evening prior to discharge, the     patient was totally asymptomatic, but consideration must be made  in     starting a beta blocker.  For this reason, the patient was started on an     ACE inhibitor upon discharge.  2. Hypertension.  During hospitalization, the patient did not exhibit any     signs of high blood pressure, although will start on low-dose ACE     inhibitor which may help if the patient does have any further problems     with his blood pressure.   DISPOSITION:  The patient is also suggested to start on aspirin 81 mg daily,  given risk for heart problems.  He is encouraged to eat a low-salt, low-  cholesterol diet, and to return to the emergency department if he has any  worsening chest pain or worsening shortness of breath.  He is to be seen on  11/15/02 at 2 p.m. in the outpatient clinic.  He will be seen by Dr. Margo Aye.  If any other problems, call [phone].                                               Catalina Pizza, M.D.    ZH/MEDQ  D:  12/10/2002  T:  12/10/2002  Job:  [id number]

## 2011-06-06 IMAGING — CT CT ABD-PELV W/ CM
2 of 5 series · 17 of 46 positions shown, 19 images · IV contrast (water & 100ml omni 300)
Comparison: 02/15/2006.

CLINICAL DATA: Right upper abdominal pain for the past 2 weeks.
Fever and diarrhea.  Previous nephrolithiasis.

CT ABDOMEN AND PELVIS WITH CONTRAST
TECHNIQUE: Multidetector CT imaging of the abdomen and pelvis was
performed following the standard protocol during bolus
administration of intravenous contrast.
Contrast: 100 ml Rmnipaque-VJJ

[Series 2: routine abdomen · axial · 0.98mm/px · z∈[-355,+70]mm · 14 of 95 slices shown, 16 images]
[im 5/95  soft-tissue]
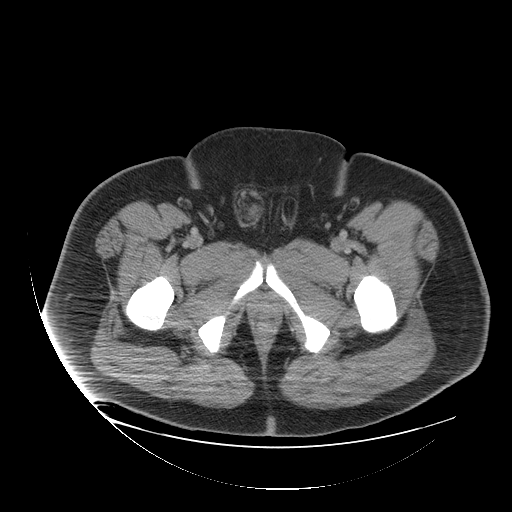
[im 5/95  bone]
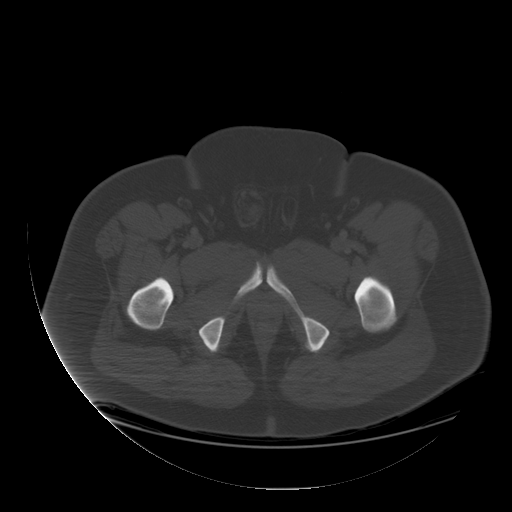
[im 10/95  soft-tissue]
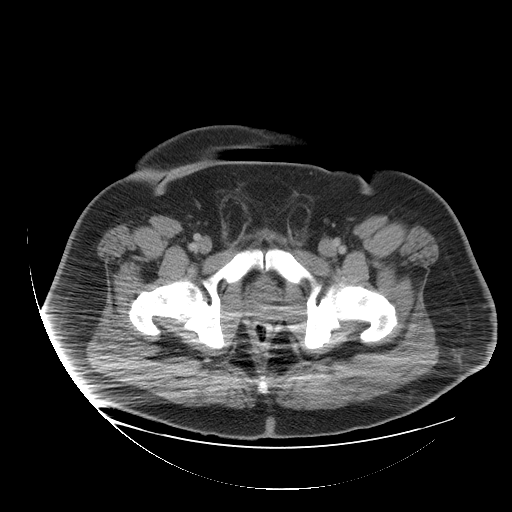
[im 20/95  soft-tissue]
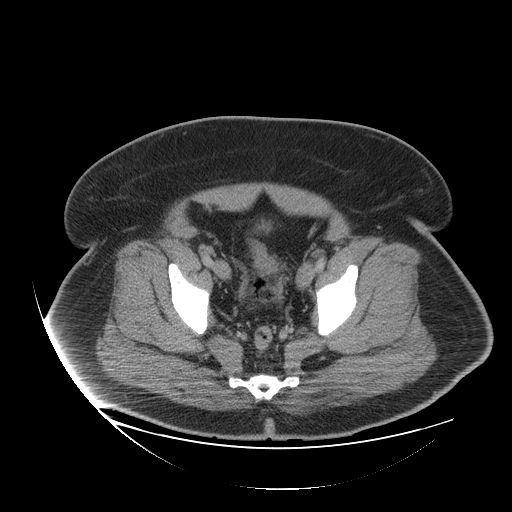
[im 25/95  soft-tissue]
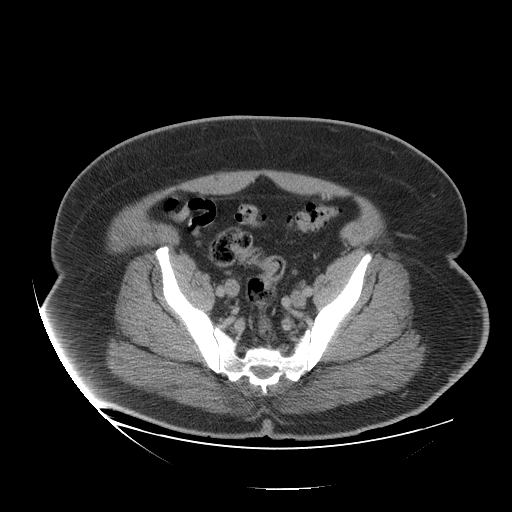
[im 30/95  soft-tissue]
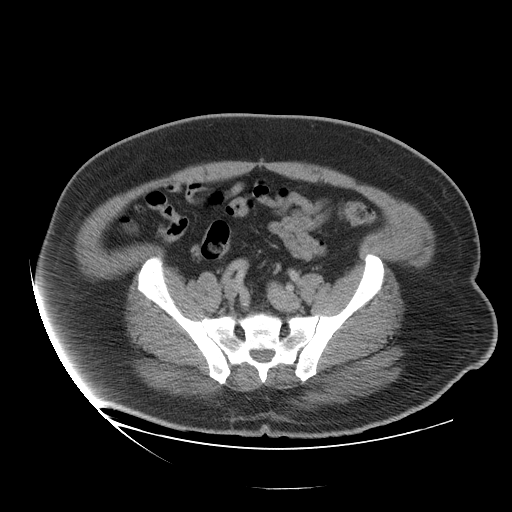
[im 40/95  soft-tissue]
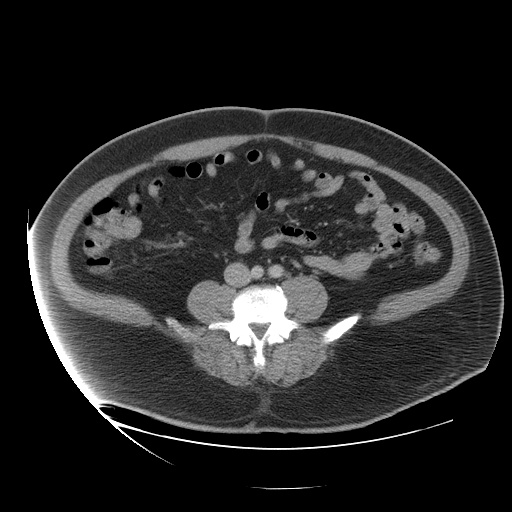
[im 45/95  soft-tissue]
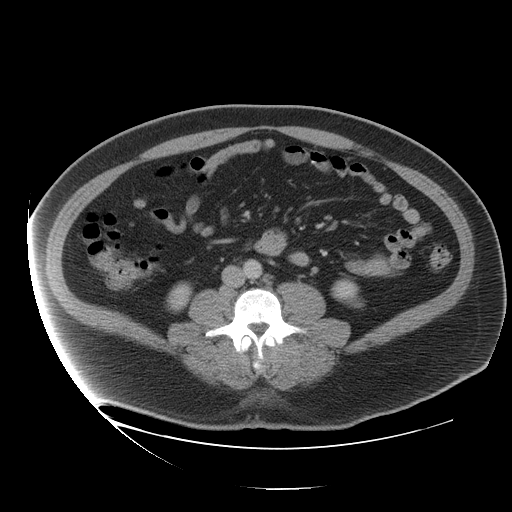
[im 50/95  soft-tissue]
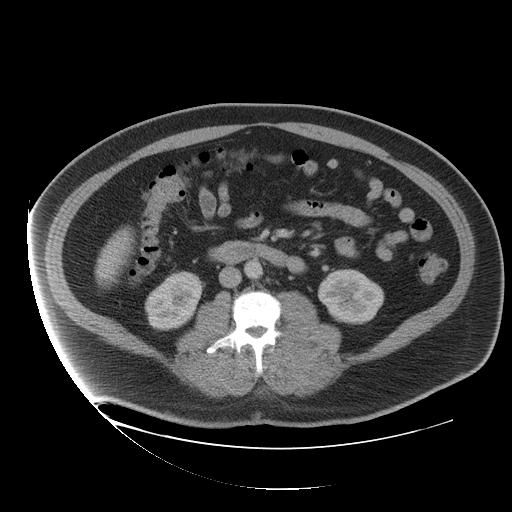
[im 55/95  soft-tissue]
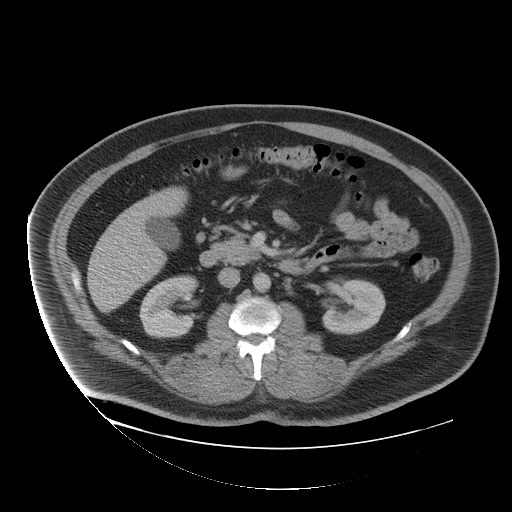
[im 55/95  bone]
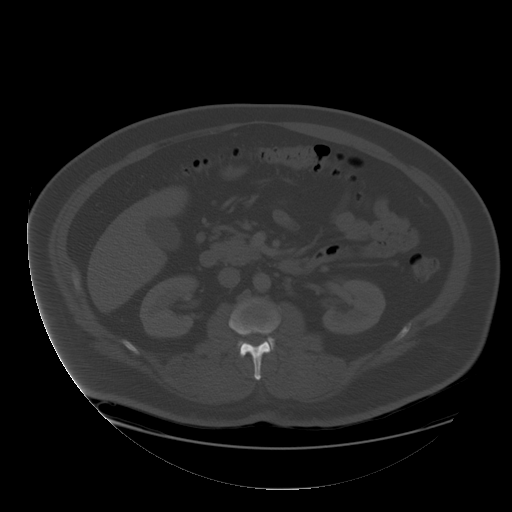
[im 65/95  soft-tissue]
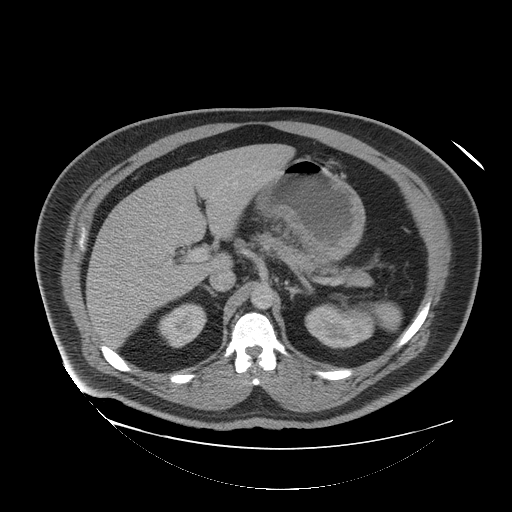
[im 70/95  soft-tissue]
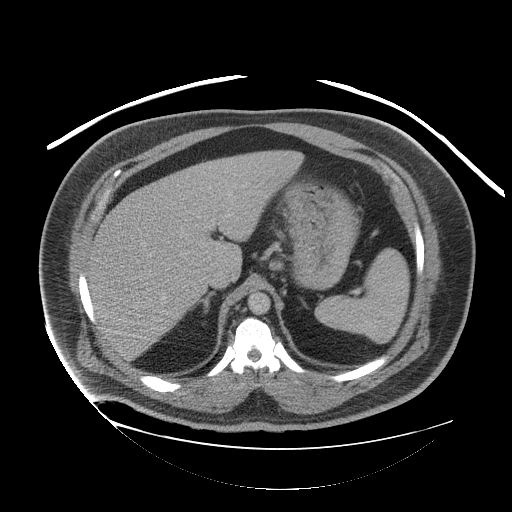
[im 75/95  soft-tissue]
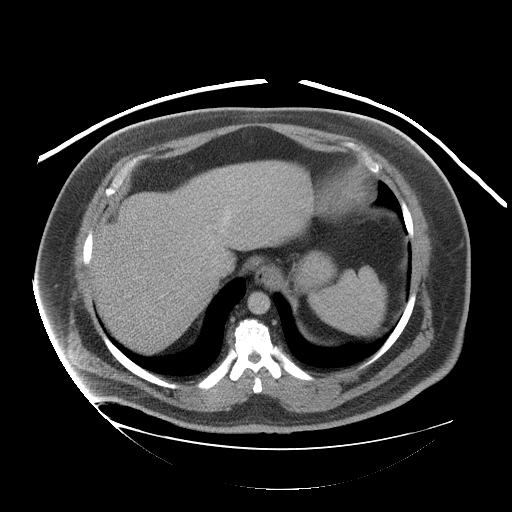
[im 85/95  soft-tissue]
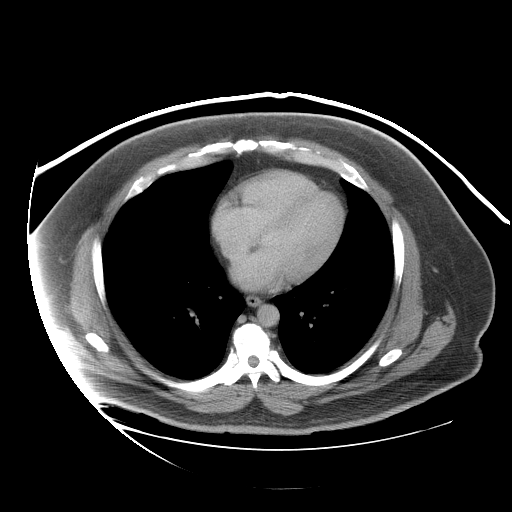
[im 90/95  soft-tissue]
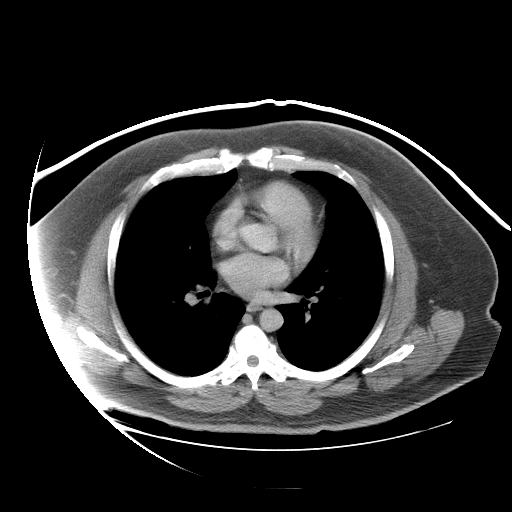

[Series 401: cor · coronal · 0.98mm/px · 3 of 105 slices shown]
[im 35/105  soft-tissue]
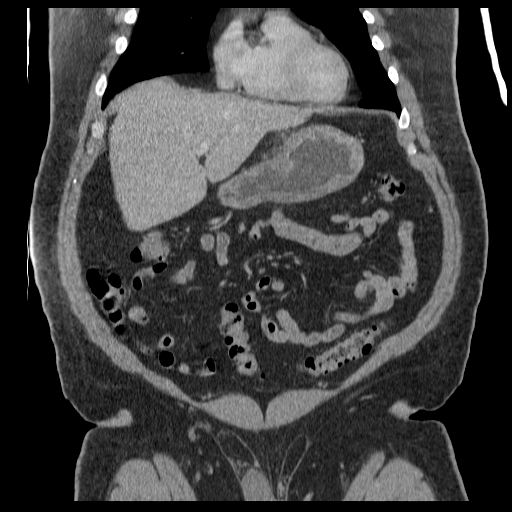
[im 47/105  soft-tissue]
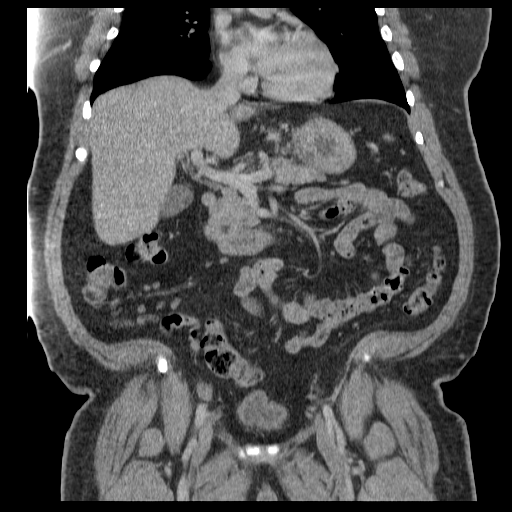
[im 58/105  soft-tissue]
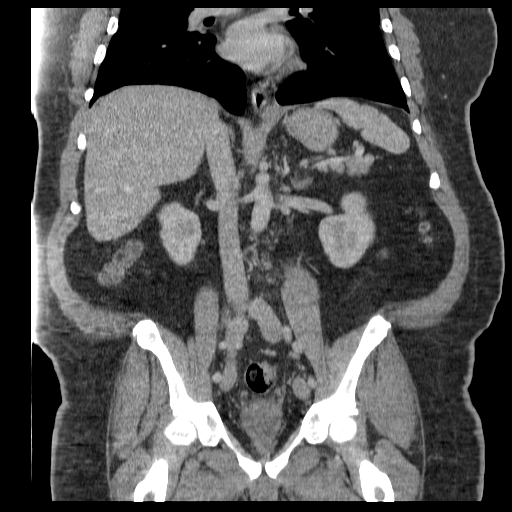

[17 of 46 positions shown; findings below may reference images not displayed]

FINDINGS: The previously seen left renal and right ureteral calculi
are no longer demonstrated.  Previously demonstrated diffuse low
density of the liver is also no longer seen.  A 1.0 cm  left renal
cyst is currently visualized.

Unremarkable liver, spleen, pancreas, gallbladder, adrenal glands,
right kidney, urinary bladder and prostate gland.  Bilateral
inguinal hernias containing fat.  There is also soft tissue density
in the inferior right inguinal canal, measuring 35 HU.  This
portion of the inguinal canal was not previously included.

Multiple sigmoid colon diverticula without evidence of
diverticulitis.  Normal appearing appendix in the upper right
pelvis.  No enlarged lymph nodes.  Clear lung bases.  Lumbar and
lower thoracic spine degenerative changes.
IMPRESSION: 1.  No acute abnormality.
2.  Bilateral inguinal hernias containing fat.
3.  Testicle or medium density fluid in the inferior right inguinal
canal.
4.  Sigmoid diverticulosis.
5.  Resolved hepatic steatosis.
6.  No residual urinary tract calculi seen.

## 2013-04-19 ENCOUNTER — Emergency Department (INDEPENDENT_AMBULATORY_CARE_PROVIDER_SITE_OTHER)
Admission: EM | Admit: 2013-04-19 | Discharge: 2013-04-19 | Disposition: A | Discharge status: Home / Self Care | Payer: Self-pay | Source: Home / Self Care | Attending: Family Medicine | Admitting: Family Medicine

## 2013-04-19 ENCOUNTER — Encounter (HOSPITAL_COMMUNITY): Payer: Self-pay | Admitting: Emergency Medicine

## 2013-04-19 DIAGNOSIS — M7741 Metatarsalgia, right foot: Secondary | ICD-10-CM

## 2013-04-19 DIAGNOSIS — M7750 Other enthesopathy of unspecified foot: Secondary | ICD-10-CM

## 2013-04-19 DIAGNOSIS — E119 Type 2 diabetes mellitus without complications: Secondary | ICD-10-CM

## 2013-04-19 HISTORY — DX: Type 2 diabetes mellitus without complications: E11.9

## 2013-04-19 LAB — POCT I-STAT, CHEM 8
BUN: 15 mg/dL (ref 6–23)
Calcium, Ion: 1 mmol/L — ABNORMAL LOW (ref 1.12–1.23)
Chloride: 102 mEq/L (ref 96–112)
Creatinine, Ser: 0.6 mg/dL (ref 0.50–1.35)
Glucose, Bld: 439 mg/dL — ABNORMAL HIGH (ref 70–99)
HCT: 48 % (ref 39.0–52.0)
Hemoglobin: 16.3 g/dL (ref 13.0–17.0)
Potassium: 4.9 mEq/L (ref 3.5–5.1)
Sodium: 132 mEq/L — ABNORMAL LOW (ref 135–145)
TCO2: 23 mmol/L (ref 0–100)

## 2013-04-19 MED ORDER — METFORMIN HCL 1000 MG PO TABS: 1000.0000 mg | ORAL_TABLET | Freq: Two times a day (BID) | ORAL | Status: DC

## 2013-04-19 MED ORDER — TRAMADOL HCL 50 MG PO TABS: 50.0000 mg | ORAL_TABLET | Freq: Three times a day (TID) | ORAL | Status: DC | PRN

## 2013-04-19 NOTE — ED Notes (Signed)
Pt c/o headache with chills x 3-4 weeks. Legs and arms ache as well. Feels numbness in his forearm and on the bottom of the feet. Patient is alert and oriented.

## 2013-04-19 NOTE — ED Provider Notes (Signed)
History     CSN: [id number]  Arrival date & time 04/19/13  1151   First MD Initiated Contact with Patient 04/19/13 1256      Chief Complaint  Patient presents with  . Headache  . Chills    (Consider location/radiation/quality/duration/timing/severity/associated sxs/prior treatment) HPI Comments: 52 year old male with history of obesity, peptic ulcer disease and diabetes. Here complaining of intermittent headaches and dizziness associated with  intermittent distal extremity numbness and tingling during the last month. Patient also presents with 30 pound weight loss during the last 2 months without exercising. Also reports increased thirst and urinary frequency. Denies dysuria or hematuria. Patient has had the diagnosis of diabetes in the past his hemoglobin A1c was 16 several years ago he was admitted in the hospital 2 years ago and normal hemoglobin A1c of 5.8. States he has not seen a doctor in the last 2 years and has not had any medications for over a year for diabetes.  Patient also complaining of right side sole pain at the metatarsal area for over a week.  reports pain is worse with wearing flexible shoes.  pain worse with walking and putting weight on the right foot. Denies recent falls or known injuries. Patient is currently not taking any medications for his symptoms.    Past Medical History  Diagnosis Date  . Diabetes mellitus without complication     History reviewed. No pertinent past surgical history.  No family history on file.  History  Substance Use Topics  . Smoking status: Never Smoker   . Smokeless tobacco: Not on file  . Alcohol Use: No      Review of Systems  Constitutional: Negative for fever and chills.  Endocrine: Negative for cold intolerance, heat intolerance, polydipsia and polyuria.  Musculoskeletal:       Right side foot pain as per HPI  Neurological: Positive for headaches. Negative for seizures, speech difficulty and weakness.    Allergies   Review of patient's allergies indicates no known allergies.  Home Medications   Current Outpatient Rx  Name  Route  Sig  Dispense  Refill  . metFORMIN (GLUCOPHAGE) 1000 MG tablet   Oral   Take 1 tablet (1,000 mg total) by mouth 2 (two) times daily with a meal.   60 tablet   2   . traMADol (ULTRAM) 50 MG tablet   Oral   Take 1 tablet (50 mg total) by mouth every 8 (eight) hours as needed for pain.   20 tablet   0     BP 130/83  Pulse 96  Temp(Src) 98.1 F (36.7 C) (Oral)  Resp 14  SpO2 96%  Physical Exam  Nursing note and vitals reviewed. Constitutional: He is oriented to person, place, and time. No distress.  Morbidly obese  HENT:  Head: Normocephalic and atraumatic.  Mouth/Throat: Oropharynx is clear and moist.  Eyes: Conjunctivae are normal. No scleral icterus.  Neck: Neck supple. No JVD present. No thyromegaly present.  Cardiovascular: Normal rate, regular rhythm and normal heart sounds.   Pulmonary/Chest: Breath sounds normal.  Musculoskeletal:  There is focal tenderness to palpation over right medial  metatarsal area. No calluses. No bruising.    Neurological: He is alert and oriented to person, place, and time. He has normal reflexes. No cranial nerve deficit. He exhibits normal muscle tone. Coordination normal.  Skin: No rash noted. He is not diaphoretic.    ED Course  Procedures (including critical care time)  Labs Reviewed  POCT I-STAT, CHEM 8 -  Abnormal; Notable for the following:    Sodium 132 (*)    Glucose, Bld 439 (*)    Calcium, Ion 1.00 (*)    All other components within normal limits   No results found.   1. Diabetes mellitus   2. Metatarsalgia, right       MDM  Restarted on metformin. Patient was placed on a rigid soled shoe. Recommended metatarsal pads and prescribed tramadol. Asked patient to establish primary care and information was given for [facility]. Supportive care and red flags that  should prompt his return to medical attention discussed with patient and provided in writing.        Sharin Grave, MD 04/21/13 [phone]

## 2013-05-12 ENCOUNTER — Emergency Department (HOSPITAL_COMMUNITY)
Admission: EM | Admit: 2013-05-12 | Discharge: 2013-05-12 | Disposition: A | Discharge status: Home / Self Care | Payer: Self-pay | Attending: Emergency Medicine | Admitting: Emergency Medicine

## 2013-05-12 ENCOUNTER — Encounter (HOSPITAL_COMMUNITY): Payer: Self-pay | Admitting: *Deleted

## 2013-05-12 DIAGNOSIS — E1169 Type 2 diabetes mellitus with other specified complication: Secondary | ICD-10-CM | POA: Insufficient documentation

## 2013-05-12 DIAGNOSIS — R21 Rash and other nonspecific skin eruption: Secondary | ICD-10-CM | POA: Insufficient documentation

## 2013-05-12 DIAGNOSIS — Z79899 Other long term (current) drug therapy: Secondary | ICD-10-CM | POA: Insufficient documentation

## 2013-05-12 DIAGNOSIS — R739 Hyperglycemia, unspecified: Secondary | ICD-10-CM

## 2013-05-12 DIAGNOSIS — R631 Polydipsia: Secondary | ICD-10-CM | POA: Insufficient documentation

## 2013-05-12 LAB — URINALYSIS, ROUTINE W REFLEX MICROSCOPIC
Bilirubin Urine: NEGATIVE
Glucose, UA: >1000 mg/dL — AB
Ketones, ur: NEGATIVE mg/dL
Leukocytes, UA: NEGATIVE
Nitrite: NEGATIVE
Protein, ur: NEGATIVE mg/dL
Specific Gravity, Urine: 1.044 — ABNORMAL HIGH (ref 1.005–1.030)
Urobilinogen, UA: 0.2 mg/dL (ref 0.0–1.0)
pH: 5.5 (ref 5.0–8.0)

## 2013-05-12 LAB — COMPREHENSIVE METABOLIC PANEL
ALT: 40 U/L (ref 0–53)
AST: 27 U/L (ref 0–37)
Albumin: 3.5 g/dL (ref 3.5–5.2)
Alkaline Phosphatase: 108 U/L (ref 39–117)
BUN: 10 mg/dL (ref 6–23)
CO2: 24 mEq/L (ref 19–32)
Calcium: 9.4 mg/dL (ref 8.4–10.5)
Chloride: 96 mEq/L (ref 96–112)
Creatinine, Ser: 0.69 mg/dL (ref 0.50–1.35)
GFR calc Af Amer: >90 mL/min (ref >90)
GFR calc non Af Amer: >90 mL/min (ref >90)
Glucose, Bld: 609 mg/dL (ref 70–99)
Potassium: 4.5 mEq/L (ref 3.5–5.1)
Sodium: 129 mEq/L — ABNORMAL LOW (ref 135–145)
Total Bilirubin: 0.4 mg/dL (ref 0.3–1.2)
Total Protein: 6.9 g/dL (ref 6.0–8.3)

## 2013-05-12 LAB — CBC
HCT: 43 % (ref 39.0–52.0)
Hemoglobin: 14.9 g/dL (ref 13.0–17.0)
MCH: 29.8 pg (ref 26.0–34.0)
MCHC: 34.7 g/dL (ref 30.0–36.0)
MCV: 86 fL (ref 78.0–100.0)
Platelets: 282 10*3/uL (ref 150–400)
RBC: 5 MIL/uL (ref 4.22–5.81)
RDW: 13.2 % (ref 11.5–15.5)
WBC: 7.7 10*3/uL (ref 4.0–10.5)

## 2013-05-12 LAB — GLUCOSE, CAPILLARY
Glucose-Capillary: 558 mg/dL (ref 70–99)
Glucose-Capillary: 167 mg/dL — ABNORMAL HIGH (ref 70–99)
Glucose-Capillary: 228 mg/dL — ABNORMAL HIGH (ref 70–99)

## 2013-05-12 LAB — URINE MICROSCOPIC-ADD ON

## 2013-05-12 MED ORDER — SODIUM CHLORIDE 0.9 % IV BOLUS (SEPSIS)
1000.0000 mL | Freq: Once | INTRAVENOUS | Status: AC
  Administered 2013-05-12: 1000 mL via INTRAVENOUS

## 2013-05-12 MED ORDER — CLOTRIMAZOLE 1 % EX CREA: TOPICAL_CREAM | CUTANEOUS | Status: DC

## 2013-05-12 MED ORDER — FLUCONAZOLE 200 MG PO TABS: 200.0000 mg | ORAL_TABLET | Freq: Every day | ORAL | Status: AC

## 2013-05-12 MED ORDER — INSULIN ASPART 100 UNIT/ML ~~LOC~~ SOLN
8.0000 [IU] | Freq: Once | SUBCUTANEOUS | Status: AC
  Administered 2013-05-12: 8 [IU] via INTRAVENOUS
  Filled 2013-05-12: qty 1

## 2013-05-12 NOTE — ED Notes (Signed)
CBG was 558. Notified Nurse Martie Lee.

## 2013-05-12 NOTE — ED Provider Notes (Signed)
History    CSN: [id number] Arrival date & time 05/12/13  1354  First MD Initiated Contact with Patient 05/12/13 1437     Chief Complaint  Patient presents with  . Hyperglycemia   (Consider location/radiation/quality/duration/timing/severity/associated sxs/prior Treatment) Patient is a 52 y.o. male presenting with rash. The history is provided by the patient. No language interpreter was used.  Rash Pain quality comment:  Rash to groin/penis that itches and burns. Associated symptoms: no dysuria, no fever, no nausea, no shortness of breath and no vomiting   Associated symptoms comment:  Rash with redness to the tip of the penis. It itches and burns. No relationship to urination. He reports having a history of diabetes with moderately controlled blood sugar. He does admit that he doesn't have a glucometer at home and rarely checks to see his blood sugar level. No nausea, vomiting, fever.  Past Medical History  Diagnosis Date  . Diabetes mellitus without complication    History reviewed. No pertinent past surgical history. No family history on file. History  Substance Use Topics  . Smoking status: Never Smoker   . Smokeless tobacco: Not on file  . Alcohol Use: No    Review of Systems  Constitutional: Negative for fever and appetite change.  Respiratory: Negative for shortness of breath.   Gastrointestinal: Negative for nausea and vomiting.  Endocrine: Positive for polydipsia.  Genitourinary: Negative for dysuria.  Musculoskeletal: Negative for myalgias.  Skin: Positive for rash.  Neurological: Negative for weakness and headaches.  Psychiatric/Behavioral: Negative for confusion.    Allergies  Review of patient's allergies indicates no known allergies.  Home Medications   Current Outpatient Rx  Name  Route  Sig  Dispense  Refill  . metFORMIN (GLUCOPHAGE) 1000 MG tablet   Oral   Take 1,000 mg by mouth 2 (two) times daily with a meal.          BP 130/85  Pulse 84   Temp(Src) 98.3 F (36.8 C) (Oral)  Resp 20  SpO2 98% Physical Exam  Constitutional: He is oriented to person, place, and time. He appears well-developed and well-nourished. No distress.  HENT:  Head: Normocephalic.  Neck: Normal range of motion. Neck supple.  Cardiovascular: Normal rate and regular rhythm.   Pulmonary/Chest: Effort normal and breath sounds normal.  Abdominal: Soft. Bowel sounds are normal. There is no tenderness. There is no rebound and no guarding.  Genitourinary:  Red non-raised rash to uncircumcised glans without significant swelling. Easily retracted. No drainage. No scrotal tenderness or swelling.   Musculoskeletal: Normal range of motion.  Neurological: He is alert and oriented to person, place, and time.  Skin: Skin is warm and dry. No rash noted.  Psychiatric: He has a normal mood and affect.    ED Course  Procedures (including critical care time) Labs Reviewed  GLUCOSE, CAPILLARY - Abnormal; Notable for the following:    Glucose-Capillary 558 (*)    All other components within normal limits  COMPREHENSIVE METABOLIC PANEL - Abnormal; Notable for the following:    Sodium 129 (*)    Glucose, Bld 609 (*)    All other components within normal limits  URINALYSIS, ROUTINE W REFLEX MICROSCOPIC - Abnormal; Notable for the following:    APPearance CLOUDY (*)    Specific Gravity, Urine 1.044 (*)    Glucose, UA >1000 (*)    Hgb urine dipstick SMALL (*)    All other components within normal limits  CBC  URINE MICROSCOPIC-ADD ON   Results for  orders placed during the hospital encounter of 05/12/13  GLUCOSE, CAPILLARY      Result Value Range   Glucose-Capillary 558 (*) 70 - 99 mg/dL   Comment 1 Documented in Chart     Comment 2 Notify RN    CBC      Result Value Range   WBC 7.7  4.0 - 10.5 K/uL   RBC 5.00  4.22 - 5.81 MIL/uL   Hemoglobin 14.9  13.0 - 17.0 g/dL   HCT 16.1  09.6 - 04.5 %   MCV 86.0  78.0 - 100.0 fL   MCH 29.8  26.0 - 34.0 pg   MCHC 34.7   30.0 - 36.0 g/dL   RDW 40.9  81.1 - 91.4 %   Platelets 282  150 - 400 K/uL  COMPREHENSIVE METABOLIC PANEL      Result Value Range   Sodium 129 (*) 135 - 145 mEq/L   Potassium 4.5  3.5 - 5.1 mEq/L   Chloride 96  96 - 112 mEq/L   CO2 24  19 - 32 mEq/L   Glucose, Bld 609 (*) 70 - 99 mg/dL   BUN 10  6 - 23 mg/dL   Creatinine, Ser 7.82  0.50 - 1.35 mg/dL   Calcium 9.4  8.4 - 95.6 mg/dL   Total Protein 6.9  6.0 - 8.3 g/dL   Albumin 3.5  3.5 - 5.2 g/dL   AST 27  0 - 37 U/L   ALT 40  0 - 53 U/L   Alkaline Phosphatase 108  39 - 117 U/L   Total Bilirubin 0.4  0.3 - 1.2 mg/dL   GFR calc non Af Amer >90  >90 mL/min   GFR calc Af Amer >90  >90 mL/min  URINALYSIS, ROUTINE W REFLEX MICROSCOPIC      Result Value Range   Color, Urine YELLOW  YELLOW   APPearance CLOUDY (*) CLEAR   Specific Gravity, Urine 1.044 (*) 1.005 - 1.030   pH 5.5  5.0 - 8.0   Glucose, UA >1000 (*) NEGATIVE mg/dL   Hgb urine dipstick SMALL (*) NEGATIVE   Bilirubin Urine NEGATIVE  NEGATIVE   Ketones, ur NEGATIVE  NEGATIVE mg/dL   Protein, ur NEGATIVE  NEGATIVE mg/dL   Urobilinogen, UA 0.2  0.0 - 1.0 mg/dL   Nitrite NEGATIVE  NEGATIVE   Leukocytes, UA NEGATIVE  NEGATIVE  URINE MICROSCOPIC-ADD ON      Result Value Range   Squamous Epithelial / LPF RARE  RARE   WBC, UA 0-2  <3 WBC/hpf   RBC / HPF 0-2  <3 RBC/hpf   Bacteria, UA RARE  RARE  GLUCOSE, CAPILLARY      Result Value Range   Glucose-Capillary 167 (*) 70 - 99 mg/dL  GLUCOSE, CAPILLARY      Result Value Range   Glucose-Capillary 228 (*) 70 - 99 mg/dL    No results found. No diagnosis found. 1. Rash, genitalia 2. hyperglycemia MDM  4:30 - His CBG is found to be significantly elevated in the ED. No evidence of DKA or metabolic shifts. IV boluses ordered along with IV insulin. Will continue to monitor but anticipate discharge home.   5:30 - Blood sugar 167 on recheck. Monitored for an additional period of time with recheck of 267. He remains comfortable. He  has PCP follow up to discuss glucometer for home use. Will treat genital rash as yeast most likely related to hyperglycemia/diabetes.   Arnoldo Hooker, PA-C 05/12/13 1932

## 2013-05-12 NOTE — ED Provider Notes (Signed)
Medical screening examination/treatment/procedure(s) were performed by non-physician practitioner and as supervising physician I was immediately available for consultation/collaboration. Devoria Albe, MD, Armando Gang   Ward Givens, MD 05/12/13 [phone]

## 2013-05-12 NOTE — ED Notes (Signed)
Pt has irritation to private area with cracking and bleeding to that area for the last week and has pain with urination.  Pt is diabetic.  Pt has not checked sugar because his machine is broke.  Pt states 3 weeks ago his sugar was in the 400s

## 2013-07-25 ENCOUNTER — Emergency Department (HOSPITAL_COMMUNITY)
Admission: EM | Admit: 2013-07-25 | Discharge: 2013-07-25 | Disposition: A | Discharge status: Home / Self Care | Payer: Self-pay | Attending: Emergency Medicine | Admitting: Emergency Medicine

## 2013-07-25 ENCOUNTER — Encounter (HOSPITAL_COMMUNITY): Payer: Self-pay | Admitting: Neurology

## 2013-07-25 DIAGNOSIS — E119 Type 2 diabetes mellitus without complications: Secondary | ICD-10-CM | POA: Insufficient documentation

## 2013-07-25 DIAGNOSIS — L538 Other specified erythematous conditions: Secondary | ICD-10-CM | POA: Insufficient documentation

## 2013-07-25 DIAGNOSIS — N471 Phimosis: Secondary | ICD-10-CM | POA: Insufficient documentation

## 2013-07-25 DIAGNOSIS — N476 Balanoposthitis: Secondary | ICD-10-CM | POA: Insufficient documentation

## 2013-07-25 DIAGNOSIS — L304 Erythema intertrigo: Secondary | ICD-10-CM

## 2013-07-25 DIAGNOSIS — N481 Balanitis: Secondary | ICD-10-CM

## 2013-07-25 DIAGNOSIS — R21 Rash and other nonspecific skin eruption: Secondary | ICD-10-CM | POA: Insufficient documentation

## 2013-07-25 DIAGNOSIS — N478 Other disorders of prepuce: Secondary | ICD-10-CM | POA: Insufficient documentation

## 2013-07-25 LAB — URINALYSIS, ROUTINE W REFLEX MICROSCOPIC
Bilirubin Urine: NEGATIVE
Glucose, UA: >1000 mg/dL — AB
Hgb urine dipstick: NEGATIVE
Ketones, ur: NEGATIVE mg/dL
Leukocytes, UA: NEGATIVE
Nitrite: NEGATIVE
Protein, ur: NEGATIVE mg/dL
Specific Gravity, Urine: 1.045 — ABNORMAL HIGH (ref 1.005–1.030)
Urobilinogen, UA: 0.2 mg/dL (ref 0.0–1.0)
pH: 5.5 (ref 5.0–8.0)

## 2013-07-25 LAB — URINE MICROSCOPIC-ADD ON

## 2013-07-25 LAB — GLUCOSE, CAPILLARY
Glucose-Capillary: 318 mg/dL — ABNORMAL HIGH (ref 70–99)
Glucose-Capillary: 312 mg/dL — ABNORMAL HIGH (ref 70–99)

## 2013-07-25 MED ORDER — NYSTATIN 100000 UNIT/GM EX OINT: TOPICAL_OINTMENT | Freq: Three times a day (TID) | CUTANEOUS | Status: DC

## 2013-07-25 MED ORDER — METFORMIN HCL 500 MG PO TABS: 500.0000 mg | ORAL_TABLET | Freq: Two times a day (BID) | ORAL | Status: DC

## 2013-07-25 NOTE — ED Notes (Signed)
Pt reporting burning to his groin x 2 months. Denies itching or dysuria. Reports small amount "jello" penile discharge that is sticky.

## 2013-07-25 NOTE — ED Provider Notes (Signed)
I saw and evaluated the patient, reviewed the resident's note and I agree with the findings and plan.   Patient is a 52 y.o. male with a history of type 2 diabetes has been off of metformin for several months due to financial reasons who presents the emergency department with itching and burning in his groin for the past 2 months. He also reports that he has had some discharge around his penis for the past several days. No fever. No dysuria or hematuria. No abdominal pain. No nausea, vomiting or diarrhea. He is not sure where his glucose normally runs. On exam, patient is hemodynamically stable, nontoxic, well-hydrated. He does have candidiasis to his groin bilaterally. Patient is not circumcised. He is able to retract his foreskin without difficulty. He does have irritation and inflammation of the glans consistent with balanitis.  No testicular pain or swelling. No scrotal masses. No inguinal lymphadenopathy. Will check CBG, urinalysis. We'll discharge home with nystatin and prescribed his metformin. Discussed with patient that this is on the floor dollar [employer] plan and he can get this medicine free at [employer]. Also given outpatient PCP information. Given return precautions.  Layla Maw Carlson Belland, DO 07/25/13 1128

## 2013-07-25 NOTE — ED Provider Notes (Signed)
CSN: [id number]     Arrival date & time 07/25/13  0847 History   First MD Initiated Contact with Patient 07/25/13 [phone]     Chief Complaint  Patient presents with  . Groin Burn   HPI Pt is a 52 y/o M with PMHx of DM II controlled on Metformin who presents today with worsening groin pruritis and penile pruritis.  Pt states he has had this in the past, back in July and was very similar.  Pt was given Diflucan and Lotrimin cream which helped with his rash, however, he ran out of this and the rash came back over the last week.  Denies any dysuria, hematuria, penile discharge, fever, chills, sweats, perineal discomfort, recent sexual encounters (over 1 yr since last sexual course), abdominal pain, nausea, vomiting, diarrhea, or any testicular discomfort or swelling.    Past Medical History  Diagnosis Date  . Diabetes mellitus without complication    History reviewed. No pertinent past surgical history. No family history on file. History  Substance Use Topics  . Smoking status: Never Smoker   . Smokeless tobacco: Not on file  . Alcohol Use: No    Review of Systems  Constitutional: Negative for fever, chills, appetite change and fatigue.  Endocrine: Negative for polyuria.  Genitourinary: Positive for penile pain. Negative for dysuria, urgency, frequency, hematuria, flank pain, decreased urine volume, discharge, penile swelling, scrotal swelling, genital sores and testicular pain.  Musculoskeletal: Negative for myalgias, back pain, joint swelling and arthralgias.  Skin: Positive for rash.  Neurological: Negative.   Hematological: Negative.   All other systems reviewed and are negative.    Allergies  Review of patient's allergies indicates no known allergies.  Home Medications   No current outpatient prescriptions on file. BP 129/81  Pulse 80  Temp(Src) 98.1 F (36.7 C) (Oral)  Resp 19  SpO2 98% Physical Exam  Constitutional: He appears well-developed and well-nourished. No  distress.  HENT:  Head: Normocephalic and atraumatic.  Eyes: Conjunctivae and EOM are normal.  Neck: Normal range of motion. Neck supple.  Cardiovascular: Normal rate and regular rhythm.   No murmur heard. Pulmonary/Chest: Effort normal and breath sounds normal.  Abdominal: Soft. Bowel sounds are normal. He exhibits no mass. There is no tenderness. There is no rebound and no guarding. Hernia confirmed negative in the right inguinal area and confirmed negative in the left inguinal area.  Genitourinary: Testes normal. Cremasteric reflex is present. Uncircumcised. Phimosis present. No paraphimosis, penile erythema or penile tenderness. No discharge found.  No perineal TTP.  + for inguinal erythema B/L.   Lymphadenopathy:       Right: No inguinal adenopathy present.       Left: No inguinal adenopathy present.  Skin: Rash noted. He is not diaphoretic.    ED Course  Procedures (including critical care time) Labs Review Labs Reviewed  URINALYSIS, ROUTINE W REFLEX MICROSCOPIC - Abnormal; Notable for the following:    Specific Gravity, Urine 1.045 (*)    Glucose, UA >1000 (*)    All other components within normal limits  URINE MICROSCOPIC-ADD ON - Abnormal; Notable for the following:    Squamous Epithelial / LPF FEW (*)    All other components within normal limits   Imaging Review No results found.  MDM   1. Balanitis   Pt with most likely yeast induced balanitis along with intertrigo.  Will get UA and CBG and if normal tx with nystatin cream to the affected area for 2-4 weeks.  Able  to urinate w/o problems and denies any penile discharge.    11:02 AM - UA showing large amount of glucose, however, no infection.  Will refill his metformin today 500 mg BID and have him f/u with PCP as list of providers given to him upon d/c.   Twana First Paulina Fusi, DO of [facility] 07/25/2013, 11:02 AM    Briscoe Deutscher, DO 07/25/13 1103

## 2014-12-25 ENCOUNTER — Emergency Department (HOSPITAL_COMMUNITY): Payer: Self-pay

## 2014-12-25 ENCOUNTER — Emergency Department (HOSPITAL_COMMUNITY)
Admission: EM | Admit: 2014-12-25 | Discharge: 2014-12-25 | Disposition: A | Discharge status: Home / Self Care | Payer: Self-pay | Attending: Emergency Medicine | Admitting: Emergency Medicine

## 2014-12-25 ENCOUNTER — Encounter (HOSPITAL_COMMUNITY): Payer: Self-pay | Admitting: *Deleted

## 2014-12-25 DIAGNOSIS — R51 Headache: Secondary | ICD-10-CM | POA: Insufficient documentation

## 2014-12-25 DIAGNOSIS — J45901 Unspecified asthma with (acute) exacerbation: Secondary | ICD-10-CM | POA: Insufficient documentation

## 2014-12-25 DIAGNOSIS — J069 Acute upper respiratory infection, unspecified: Secondary | ICD-10-CM | POA: Insufficient documentation

## 2014-12-25 LAB — BASIC METABOLIC PANEL
Anion gap: 9 (ref 5–15)
BUN: 11 mg/dL (ref 6–23)
CALCIUM: 8.6 mg/dL (ref 8.4–10.5)
CO2: 24 mmol/L (ref 19–32)
Chloride: 103 mmol/L (ref 96–112)
Creatinine, Ser: 0.72 mg/dL (ref 0.50–1.35)
GFR calc Af Amer: >90 mL/min (ref >90)
GLUCOSE: 232 mg/dL — AB (ref 70–99)
Potassium: 3.9 mmol/L (ref 3.5–5.1)
Sodium: 136 mmol/L (ref 135–145)

## 2014-12-25 LAB — CBC
HCT: 41.4 % (ref 39.0–52.0)
Hemoglobin: 14.1 g/dL (ref 13.0–17.0)
MCH: 29 pg (ref 26.0–34.0)
MCHC: 34.1 g/dL (ref 30.0–36.0)
MCV: 85.2 fL (ref 78.0–100.0)
Platelets: 215 10*3/uL (ref 150–400)
RBC: 4.86 MIL/uL (ref 4.22–5.81)
RDW: 13 % (ref 11.5–15.5)
WBC: 3.6 10*3/uL — ABNORMAL LOW (ref 4.0–10.5)

## 2014-12-25 LAB — I-STAT TROPONIN, ED: Troponin i, poc: 0 ng/mL (ref 0.00–0.08)

## 2014-12-25 LAB — BRAIN NATRIURETIC PEPTIDE: B Natriuretic Peptide: 5.2 pg/mL (ref 0.0–100.0)

## 2014-12-25 MED ORDER — KETOROLAC TROMETHAMINE 30 MG/ML IJ SOLN
30.0000 mg | Freq: Once | INTRAMUSCULAR | Status: AC
Start: 2014-12-25 — End: 2014-12-25
  Administered 2014-12-25: 30 mg via INTRAVENOUS
  Filled 2014-12-25: qty 1

## 2014-12-25 MED ORDER — ALBUTEROL SULFATE (2.5 MG/3ML) 0.083% IN NEBU
2.5000 mg | INHALATION_SOLUTION | Freq: Once | RESPIRATORY_TRACT | Status: AC
  Administered 2014-12-25: 2.5 mg via RESPIRATORY_TRACT
  Filled 2014-12-25: qty 3

## 2014-12-25 MED ORDER — METFORMIN HCL 500 MG PO TABS: 500.0000 mg | ORAL_TABLET | Freq: Every day | ORAL | Status: DC

## 2014-12-25 MED ORDER — ONDANSETRON HCL 4 MG/2ML IJ SOLN
4.0000 mg | INTRAMUSCULAR | Status: AC
  Administered 2014-12-25: 4 mg via INTRAVENOUS
  Filled 2014-12-25: qty 2

## 2014-12-25 MED ORDER — SODIUM CHLORIDE 0.9 % IV BOLUS (SEPSIS)
1000.0000 mL | INTRAVENOUS | Status: AC
  Administered 2014-12-25: 1000 mL via INTRAVENOUS

## 2014-12-25 MED ORDER — PREDNISONE 20 MG PO TABS: 40.0000 mg | ORAL_TABLET | Freq: Every day | ORAL | Status: DC

## 2014-12-25 MED ORDER — PREDNISONE 20 MG PO TABS
60.0000 mg | ORAL_TABLET | Freq: Once | ORAL | Status: AC
  Administered 2014-12-25: 60 mg via ORAL
  Filled 2014-12-25: qty 3

## 2014-12-25 MED ORDER — ALBUTEROL SULFATE 108 (90 BASE) MCG/ACT IN AEPB: 2.0000 | INHALATION_SPRAY | RESPIRATORY_TRACT | Status: DC | PRN

## 2014-12-25 MED ORDER — ALBUTEROL SULFATE (2.5 MG/3ML) 0.083% IN NEBU
5.0000 mg | INHALATION_SOLUTION | Freq: Once | RESPIRATORY_TRACT | Status: AC
  Administered 2014-12-25: 5 mg via RESPIRATORY_TRACT
  Filled 2014-12-25: qty 6

## 2014-12-25 MED ORDER — ALBUTEROL SULFATE HFA 108 (90 BASE) MCG/ACT IN AERS: 2.0000 | INHALATION_SPRAY | RESPIRATORY_TRACT | Status: DC | PRN

## 2014-12-25 MED ORDER — IPRATROPIUM BROMIDE 0.02 % IN SOLN
0.5000 mg | Freq: Once | RESPIRATORY_TRACT | Status: AC
  Administered 2014-12-25: 0.5 mg via RESPIRATORY_TRACT
  Filled 2014-12-25: qty 2.5

## 2014-12-25 MED ORDER — ALBUTEROL SULFATE HFA 108 (90 BASE) MCG/ACT IN AERS
2.0000 | INHALATION_SPRAY | RESPIRATORY_TRACT | Status: AC
  Administered 2014-12-25: 2 via RESPIRATORY_TRACT
  Filled 2014-12-25: qty 6.7

## 2014-12-25 NOTE — Progress Notes (Signed)
ED CM consulted to meet with patient concerning medication assistance.  Patient presented to [facility] ED with c/o cough, CP.  Reviewed record, No PCP, or health insurance listed .  Met with patient at bedside, confirmed information patient reports not having health insurance or a PCP . Discussed with patient importance and benefits of f/u with a PCP, and counseled patient against the use of  the ED for primary care needs, patient verbalized understanding and was agreeable. Discussed other options, provided list of local  affordable PCPs.  Discussed the [facility] and the services provided.  Patient agreeable to establish care with the [facility]. Explained patient's  have the privilege of using [facility] pharmacy as early as tomorrow, once he an appt. Is scheduled. Patient verbalized understanding. Discussed the  Financial Eligibility Counselor on site who will assist with [employer] and process. Discussed the Walk- in Clinic held on Monday Feb 29 th at 10:15am patient agreeable. Patient verbalized understanding and appreciation, teach back was done.  Discussed  disposition  plan  with E. Tysinger NP, she is prescribing abluterol inhaler, Metformin, prednisone. Provided patient with a free Albuterol 30 day trail card, other medications on the [facility] $4 list, patient states, he will be able to afford.  Updated E. Tysinger PA-C. No further ED CM needs identified.

## 2014-12-25 NOTE — ED Provider Notes (Signed)
CSN: [id number]     Arrival date & time 12/25/14  1425 History   First MD Initiated Contact with Patient 12/25/14 1809     Chief Complaint  Patient presents with  . Chest Pain  . URI   (Consider location/radiation/quality/duration/timing/severity/associated sxs/prior Treatment) HPI  Allen Lowery is a 54 yo male presenting with report of chills and cough.  He states he began having a cough 3 days ago that initially was non-productive but now has green sputum with his cough.  He also reports intermittent chills, and a headache.  He reports chest pain with coughing but no chest pain otherwise.  He currently rates his pain as 5/10. He also denies nausea, vomiting, or abd pain.   History reviewed. No pertinent past medical history. Past Surgical History  Procedure Laterality Date  . Kidney stone surgery     No family history on file. History  Substance Use Topics  . Smoking status: Never Smoker   . Smokeless tobacco: Not on file  . Alcohol Use: No    Review of Systems  Constitutional: Positive for chills. Negative for fever.  HENT: Negative for sore throat.   Eyes: Negative for visual disturbance.  Respiratory: Positive for cough, chest tightness, shortness of breath and wheezing.   Cardiovascular: Negative for chest pain and leg swelling.  Gastrointestinal: Negative for nausea, vomiting and diarrhea.  Genitourinary: Negative for dysuria.  Musculoskeletal: Negative for myalgias.  Skin: Negative for rash.  Neurological: Positive for headaches. Negative for weakness and numbness.    Allergies  Aspirin  Home Medications   Prior to Admission medications   Not on File   BP 146/89 mmHg  Pulse 76  Temp(Src) 98.3 F (36.8 C) (Oral)  Resp 19  Ht  (1.676 m)  SpO2 95% Physical Exam  Constitutional: He appears well-developed and well-nourished. No distress.  HENT:  Head: Normocephalic and atraumatic.  Mouth/Throat: Oropharynx is clear and moist. No oropharyngeal  exudate.  Eyes: Conjunctivae are normal.  Neck: Neck supple. No thyromegaly present.  Cardiovascular: Normal rate, regular rhythm and intact distal pulses.   Pulmonary/Chest: Effort normal. No respiratory distress. He has wheezes in the right middle field, the right lower field, the left middle field and the left lower field. He has no rhonchi. He has no rales. He exhibits no tenderness.  Abdominal: Soft. There is no tenderness.  Musculoskeletal: He exhibits no tenderness.  Lymphadenopathy:    He has no cervical adenopathy.  Neurological: He is alert.  Skin: Skin is warm and dry. No rash noted. He is not diaphoretic.  Psychiatric: He has a normal mood and affect.  Nursing note and vitals reviewed.   ED Course  Procedures (including critical care time) Labs Review Labs Reviewed  CBC - Abnormal; Notable for the following:    WBC 3.6 (*)    All other components within normal limits  BASIC METABOLIC PANEL - Abnormal; Notable for the following:    Glucose, Bld 232 (*)    All other components within normal limits  BRAIN NATRIURETIC PEPTIDE  I-STAT TROPOININ, ED    Imaging Review Dg Chest 2 View  12/25/2014   CLINICAL DATA:  54 year old male with mid chest pain and productive cough for 2 weeks. URI. Initial encounter.  EXAM: CHEST  2 VIEW  COMPARISON:  None.  FINDINGS: Lung volumes are within normal limits. Normal cardiac size and mediastinal contours. Visualized tracheal air column is within normal limits. No pneumothorax, pulmonary edema, pleural effusion or confluent pulmonary opacity. Mildly  increased interstitial markings, favor chronic. No acute osseous abnormality identified.  IMPRESSION: No acute cardiopulmonary abnormality.   Electronically Signed   By: Odessa FlemingH  Hall M.D.   On: 12/25/2014 15:35     EKG Interpretation   Date/Time:  Wednesday December 25 2014 14:29:43 EST Ventricular Rate:  83 PR Interval:  150 QRS Duration: 88 QT Interval:  376 QTC Calculation: 441 R Axis:    51 Text Interpretation:  Normal sinus rhythm Normal ECG No old tracing to  compare Confirmed by [facility]  MD, MARTHA [phone]) on 12/25/2014 9:36:15 PM      MDM   Final diagnoses:  URI (upper respiratory infection)  Asthma exacerbation   54 yo with symptoms consistent with URI but poorly managed asthma and exacerbation of symptoms.  2 albuterol nebs given in ED with improved lung exam.  Glucose noted to be elevated to 232, discussed with pt who also reports history of diabetes but non adherence to medications. Prednisone given in ED. Care mgmt consulted to help establish care with PCP and prescriptions for albuterol, steroids and metformin provided.  Discussed importance of metformin and PCP follow-up because steroids can further elevate blood sugar. Patient able to ambulate and maintain O2 sats to 94% in ED and  no current signs of respiratory distress. Pt is well-appearing, in no acute distress and vital signs reviewed and not concerning. He appears safe to be discharged.  Return precautions provided. Pt aware of plan and in agreement.     Filed Vitals:   12/25/14 2100 12/25/14 2115 12/25/14 2130 12/25/14 2145  BP: 127/81 141/84 135/80 136/79  Pulse: 79 83 88 88  Temp:      TempSrc:      Resp: 12 23 17 19   Height:      SpO2: 95% 96% 98%    Meds given in ED:  Medications  sodium chloride 0.9 % bolus 1,000 mL (0 mLs Intravenous Stopped 12/25/14 2052)  ketorolac (TORADOL) 30 MG/ML injection 30 mg (30 mg Intravenous Given 12/25/14 1858)  ondansetron (ZOFRAN) injection 4 mg (4 mg Intravenous Given 12/25/14 1858)  albuterol (PROVENTIL) (2.5 MG/3ML) 0.083% nebulizer solution 5 mg (5 mg Nebulization Given 12/25/14 1858)  ipratropium (ATROVENT) nebulizer solution 0.5 mg (0.5 mg Nebulization Given 12/25/14 1858)  albuterol (PROVENTIL) (2.5 MG/3ML) 0.083% nebulizer solution 2.5 mg (2.5 mg Nebulization Given 12/25/14 2053)  predniSONE (DELTASONE) tablet 60 mg (60 mg Oral Given 12/25/14 2053)  albuterol  (PROVENTIL HFA;VENTOLIN HFA) 108 (90 BASE) MCG/ACT inhaler 2 puff (2 puffs Inhalation Given 12/25/14 2155)    Discharge Medication List as of 12/25/2014  9:43 PM    START taking these medications   Details  metFORMIN (GLUCOPHAGE) 500 MG tablet Take 1 tablet (500 mg total) by mouth daily with breakfast., Starting 12/25/2014, Until Discontinued, Print    albuterol (PROVENTIL HFA;VENTOLIN HFA) 108 (90 BASE) MCG/ACT inhaler Inhale 2 puffs into the lungs every 4 (four) hours as needed for wheezing or shortness of breath., Starting 12/25/2014, Until Discontinued, Print    predniSONE (DELTASONE) 20 MG tablet Take 2 tablets (40 mg total) by mouth daily., Starting 12/25/2014, Until Discontinued, Print           Harle BattiestElizabeth Janise Gora, NP 12/26/14 2352  Ethelda ChickMartha K Linker, MD 12/26/14 [phone]

## 2014-12-25 NOTE — Discharge Instructions (Signed)
Please follow the directions provided.  [facility].USe your inhaler 2 puffs every 4 hours to help with cough and shortness of breath.  Take the prednisone one time a day for 5 days.  Start the metformin 1 time a day with breakfast.  Don't hesitate to return for any new, worsening or concerning symptoms.     SEEK MEDICAL CARE IF:  After the first few days, you feel you are getting worse rather than better.  You need your caregiver's advice about medicines to control symptoms.  You develop chills, worsening shortness of breath, or brown or red sputum. These may be signs of pneumonia.  You develop yellow or brown nasal discharge or pain in the face, especially when you bend forward. These may be signs of sinusitis.  You develop a fever, swollen neck glands, pain with swallowing, or white areas in the back of your throat. These may be signs of strep throat. SEEK IMMEDIATE MEDICAL CARE IF:  You have a fever.  You develop severe or persistent headache, ear pain, sinus pain, or chest pain.  You develop wheezing, a prolonged cough, cough up blood, or have a change in your usual mucus (if you have chronic lung disease).  You develop sore muscles or a stiff neck.

## 2014-12-25 NOTE — ED Notes (Signed)
Patient with reported onset of chest pain, uri sx, and headache for 3 days.  He also states he is having flack pain bil.  Patient states his eyes feels like there is pressure.  He also reports he feels weak.  Patient is alert.  No recent travel.  He states he cannot sleep due to sx

## 2015-07-18 ENCOUNTER — Encounter (HOSPITAL_COMMUNITY): Payer: Self-pay | Admitting: Emergency Medicine

## 2015-07-18 ENCOUNTER — Emergency Department (HOSPITAL_COMMUNITY)
Admission: EM | Admit: 2015-07-18 | Discharge: 2015-07-18 | Disposition: A | Discharge status: Home / Self Care | Payer: Self-pay | Attending: Emergency Medicine | Admitting: Emergency Medicine

## 2015-07-18 DIAGNOSIS — R739 Hyperglycemia, unspecified: Secondary | ICD-10-CM

## 2015-07-18 DIAGNOSIS — Z79899 Other long term (current) drug therapy: Secondary | ICD-10-CM | POA: Insufficient documentation

## 2015-07-18 DIAGNOSIS — M5440 Lumbago with sciatica, unspecified side: Secondary | ICD-10-CM | POA: Insufficient documentation

## 2015-07-18 DIAGNOSIS — M545 Low back pain: Secondary | ICD-10-CM

## 2015-07-18 DIAGNOSIS — E1165 Type 2 diabetes mellitus with hyperglycemia: Secondary | ICD-10-CM | POA: Insufficient documentation

## 2015-07-18 DIAGNOSIS — M5416 Radiculopathy, lumbar region: Secondary | ICD-10-CM | POA: Insufficient documentation

## 2015-07-18 DIAGNOSIS — Z791 Long term (current) use of non-steroidal anti-inflammatories (NSAID): Secondary | ICD-10-CM | POA: Insufficient documentation

## 2015-07-18 LAB — I-STAT CHEM 8, ED
BUN: 12 mg/dL (ref 6–20)
Calcium, Ion: 1.18 mmol/L (ref 1.12–1.23)
Chloride: 102 mmol/L (ref 101–111)
Creatinine, Ser: 0.4 mg/dL — ABNORMAL LOW (ref 0.61–1.24)
Glucose, Bld: 294 mg/dL — ABNORMAL HIGH (ref 65–99)
HCT: 47 % (ref 39.0–52.0)
Hemoglobin: 16 g/dL (ref 13.0–17.0)
Potassium: 4.1 mmol/L (ref 3.5–5.1)
Sodium: 135 mmol/L (ref 135–145)
TCO2: 20 mmol/L (ref 0–100)

## 2015-07-18 LAB — URINE MICROSCOPIC-ADD ON

## 2015-07-18 LAB — URINALYSIS, ROUTINE W REFLEX MICROSCOPIC
Bilirubin Urine: NEGATIVE
Glucose, UA: >1000 mg/dL — AB
Hgb urine dipstick: NEGATIVE
Ketones, ur: 15 mg/dL — AB
Leukocytes, UA: NEGATIVE
Nitrite: NEGATIVE
Protein, ur: NEGATIVE mg/dL
Specific Gravity, Urine: 1.045 — ABNORMAL HIGH (ref 1.005–1.030)
Urobilinogen, UA: 0.2 mg/dL (ref 0.0–1.0)
pH: 5 (ref 5.0–8.0)

## 2015-07-18 MED ORDER — DEXAMETHASONE SODIUM PHOSPHATE 10 MG/ML IJ SOLN
10.0000 mg | Freq: Once | INTRAMUSCULAR | Status: AC
  Administered 2015-07-18: 10 mg via INTRAMUSCULAR
  Filled 2015-07-18: qty 1

## 2015-07-18 MED ORDER — OXYCODONE-ACETAMINOPHEN 5-325 MG PO TABS
1.0000 | ORAL_TABLET | Freq: Once | ORAL | Status: AC
  Administered 2015-07-18: 1 via ORAL
  Filled 2015-07-18: qty 1

## 2015-07-18 MED ORDER — METFORMIN HCL 500 MG PO TABS
500.0000 mg | ORAL_TABLET | Freq: Once | ORAL | Status: AC
  Administered 2015-07-18: 500 mg via ORAL
  Filled 2015-07-18: qty 1

## 2015-07-18 MED ORDER — MELOXICAM 7.5 MG PO TABS: 7.5000 mg | ORAL_TABLET | Freq: Every day | ORAL | Status: DC

## 2015-07-18 MED ORDER — METFORMIN HCL 500 MG PO TABS: 500.0000 mg | ORAL_TABLET | Freq: Two times a day (BID) | ORAL | Status: DC

## 2015-07-18 MED ORDER — METHOCARBAMOL 500 MG PO TABS: 500.0000 mg | ORAL_TABLET | Freq: Four times a day (QID) | ORAL | Status: DC | PRN

## 2015-07-18 NOTE — ED Provider Notes (Signed)
CSN: [id number]     Arrival date & time 07/18/15  4098 History  This chart was scribed for non-physician practitioner, Trixie Dredge, PA-C working with Margarita Grizzle, MD, by Jarvis Morgan, ED Scribe. This patient was seen in room TR05C/TR05C and the patient's care was started at 9:07 AM.     Chief Complaint  Patient presents with  . Back Pain  . Leg Pain    The history is provided by the patient. No language interpreter was used.    HPI Comments: Allen Lowery is a 54 y.o. male who presents to the Emergency Department complaining of constant, moderate, right lower back onset 1 month that began to gradually worsening 5 days ago. Pt states that the pain is beginning to keep him from sleep. He reports the pain radiates down into right leg and to his upper back. He endorses associated weakness in right leg. Pt reports that the pain is exacerbated by ambulation and stretching upwards. Pt is taking ibuprofen without relief.  He denies any injury or recent heavy lifting and states he woke up with the pain. Pt is ambulatory with a limp. He denies any fever, chills, abdominal pain, hematuria, urinary frequency or urinary, or urinary/bowel incontinence, saddle anesthesia, numbness of the extremities.  Past Medical History  Diagnosis Date  . Diabetes mellitus without complication    History reviewed. No pertinent past surgical history. No family history on file. Social History  Substance Use Topics  . Smoking status: Never Smoker   . Smokeless tobacco: None  . Alcohol Use: No    Review of Systems  Constitutional: Negative for fever and chills.  Cardiovascular: Negative for leg swelling.  Gastrointestinal: Negative for abdominal pain and bowel incontinence.  Genitourinary: Negative for bladder incontinence and dysuria.  Musculoskeletal: Positive for back pain and gait problem (w/ limp).  Skin: Negative for color change.  Allergic/Immunologic: Positive for immunocompromised state.   Neurological: Positive for weakness (R leg). Negative for numbness.  Psychiatric/Behavioral: Negative for self-injury.      Allergies  Review of patient's allergies indicates no known allergies.  Home Medications   Prior to Admission medications   Medication Sig Start Date End Date Taking? Authorizing Provider  meloxicam (MOBIC) 7.5 MG tablet Take 1 tablet (7.5 mg total) by mouth daily. 07/18/15   Trixie Dredge, PA-C  metFORMIN (GLUCOPHAGE) 500 MG tablet Take 1 tablet (500 mg total) by mouth 2 (two) times daily with a meal. 07/18/15   Trixie Dredge, PA-C  methocarbamol (ROBAXIN) 500 MG tablet Take 1-2 tablets (500-1,000 mg total) by mouth every 6 (six) hours as needed. 07/18/15   Trixie Dredge, PA-C  nystatin ointment (MYCOSTATIN) Apply topically 3 (three) times daily. 07/25/13   Briscoe Deutscher, DO   Triage Vitals: BP 131/86 mmHg  Pulse 68  Temp(Src) 98.1 F (36.7 C) (Oral)  Resp 18  Ht 5\' 6"  (1.676 m)  Wt 208 lb (94.348 kg)  BMI 33.59 kg/m2  SpO2 97%  Physical Exam  Constitutional: He appears well-developed and well-nourished. No distress.  HENT:  Head: Normocephalic and atraumatic.  Neck: Neck supple.  Pulmonary/Chest: Effort normal.  Abdominal: Soft. He exhibits no distension. There is no tenderness. There is no rebound and no guarding.  obese  Musculoskeletal:  Tenderness in right lower back into right buttock Decreased strength in right foot with dorsiflexion only, otherwise 5/5 strength.  Lower extremities:  sensation intact, distal pulses intact.    Spine nontender, no crepitus, or stepoffs.   Neurological: He is alert.  Limping gait  Skin: He is not diaphoretic.  Psychiatric: He has a normal mood and affect. His behavior is normal.  Nursing note and vitals reviewed.   ED Course  Procedures (including critical care time)  DIAGNOSTIC STUDIES: Oxygen Saturation is 97% on RA, normal by my interpretation.    COORDINATION OF CARE: 9:41 AM- Will order percocet, UA and  I-stat chem 8.Pt advised of plan for treatment and pt agrees.     Labs Review Labs Reviewed  URINALYSIS, ROUTINE W REFLEX MICROSCOPIC (NOT AT [facility]) - Abnormal; Notable for the following:    Specific Gravity, Urine 1.045 (*)    Glucose, UA >1000 (*)    Ketones, ur 15 (*)    All other components within normal limits  URINE MICROSCOPIC-ADD ON - Abnormal; Notable for the following:    Casts HYALINE CASTS (*)    All other components within normal limits  I-STAT CHEM 8, ED - Abnormal; Notable for the following:    Creatinine, Ser 0.40 (*)    Glucose, Bld 294 (*)    All other components within normal limits    Imaging Review No results found. I have personally reviewed and evaluated these lab results as part of my medical decision-making.   EKG Interpretation None       Pt states he used to be on metformin for diabetes but does not have a doctor anymore or health insurance.  States he feels completely fine and has no symptoms from the diabetes.  Denies even polydipsia, polyuria.  Will give prescription and resources.    MDM   Final diagnoses:  Right low back pain, with sciatica presence unspecified  Lumbar radiculopathy  Hyperglycemia    Afebrile, nontoxic patient with right sided low back pain with radiation into the right leg.  No bony tenderness.  Slight weakness with dorsiflexion of right foot.  Pt is diabetic, not currently being treated. Hyperglycemic at 294.  No DKA. This has been ongoing and worsening over 1 month without injury.  Pt otherwise feels very well.  I have strongly urged him to follow up with primary care and have provided resources for him.  Have also provided Rx metformin.  +medications for back pain - mobic, robaxin.  Decadron and percocet given in ED.D/C home with neurosurgery and PCP follow up.  Discussed result, findings, treatment, and follow up  with patient.  Pt given return precautions.  Pt verbalizes understanding and agrees with plan.       I  personally performed the services described in this documentation, which was scribed in my presence. The recorded information has been reviewed and is accurate.       Trixie Dredge, PA-C 07/18/15 1439  Margarita Grizzle, MD 07/29/15 1346

## 2015-07-18 NOTE — Discharge Instructions (Signed)
Read the information below.  Use the prescribed medication as directed.  Please discuss all new medications with your pharmacist.  Do not take additional tylenol while taking the prescribed pain medication to avoid overdose.  You may return to the Emergency Department at any time for worsening condition or any new symptoms that concern you.    If you develop fevers, loss of control of bowel or bladder, weakness or numbness in your legs, or are unable to walk, return to the ER for a recheck.   Lea la siguiente informacin . Usar el medicamento recetado como se indica. Por favor discutir todos los medicamentos nuevos con su farmacutico . No tome Tylenol adicional mientras est tomando el medicamento para el dolor prescrita para evitar sobredosis. Usted puede volver a la sala de urgencias en cualquier momento por cualquier condicin o nuevos sntomas que le preocupan empeoramiento . Si se presenta fiebre , prdida de control del intestino o la vejiga , debilidad o entumecimiento en las piernas , o es incapaz de Advertising account planner , Programme researcher, broadcasting/film/video a la sala de Database administrator a Ecologist

## 2015-07-18 NOTE — ED Notes (Signed)
Pt c/o right low back pain that radiates down right leg x 1 month. Pt ambulatory with a limp.

## 2015-07-21 ENCOUNTER — Emergency Department (HOSPITAL_COMMUNITY)
Admission: EM | Admit: 2015-07-21 | Discharge: 2015-07-21 | Disposition: A | Discharge status: Home / Self Care | Payer: Self-pay | Attending: Emergency Medicine | Admitting: Emergency Medicine

## 2015-07-21 ENCOUNTER — Encounter (HOSPITAL_COMMUNITY): Payer: Self-pay | Admitting: Emergency Medicine

## 2015-07-21 DIAGNOSIS — E119 Type 2 diabetes mellitus without complications: Secondary | ICD-10-CM | POA: Insufficient documentation

## 2015-07-21 DIAGNOSIS — M5441 Lumbago with sciatica, right side: Secondary | ICD-10-CM | POA: Insufficient documentation

## 2015-07-21 DIAGNOSIS — Z79899 Other long term (current) drug therapy: Secondary | ICD-10-CM | POA: Insufficient documentation

## 2015-07-21 MED ORDER — NAPROXEN 250 MG PO TABS: 250.0000 mg | ORAL_TABLET | Freq: Two times a day (BID) | ORAL | Status: DC

## 2015-07-21 MED ORDER — METHOCARBAMOL 500 MG PO TABS: 500.0000 mg | ORAL_TABLET | Freq: Two times a day (BID) | ORAL | Status: DC | PRN

## 2015-07-21 NOTE — Discharge Instructions (Signed)
Ejercicios para la espalda (Back Exercises) Estos ejercicios ayudan a tratar y prevenir lesiones en la espalda. El objetivo es aumentar la fuerza de los msculos abdominales y dorsales y la flexibilidad de la espalda. Debe comenzar con estos ejercicios cuando ya no tenga dolor. Los ejercicios para la espalda incluyen:  Inclinacin de la pelvis - Recustese sobre la espalda con las rodillas flexionadas. Incline la pelvis hasta que la parte inferior de la espalda se apoye en el piso. Mantenga esta posicin durante 5 a 10 segundos y repita entre 5 y 15 veces.  Rodilla al pecho - Empuje primero una rodilla contra el pecho y [place] 20 a 30 segundos; repita con la otra rodilla y luego con ambas a la vez. Esto puede realizarlo con la otra pierna extendida o flexionada, del modo en que se sienta ms cmodo.  Abdominales o despegar el cccix del suelo empleando la musculatura abdominal - [place] 90 grados. Comience inclinando la pelvis y realice un ejercicio abdominal lento y parcial, elevando el tronco slo entre 71 y 66 grados del suelo. Emplee al [employer] 2 y 3 segundos para cada abdominal. No realice los abdominales con las rodillas extendidas. Si le resulta difcil realizar abdominales parciales, simplemente haga lo que se explic anteriormente, pero slo contraiga los msculos abdominales y Civil engineer, contracting tal como se le ha indicado.  Inclinacin de la cadera - Recustese sobre la espalda con las rodillas flexionadas a 90 grados. Empjese con los pies y los hombros mientras eleva la cadera un par de centmetros del suelo, [place] durante 10 segundos y repita entre 5 y 10 veces.  Arcos dorsales - Acustese sobre el [place] e impulse el tronco hacia atrs sobre los codos flexionados. Presione lentamente con las manos, formando un arco con la zona inferior de la espalda. Repita entre 3 y 5 veces. Al realizar las repeticiones, luego de un tiempo disminuirn la rigidez y las  [place].  Elevacin de los hombros - Acustese hacia abajo con los brazos a los lados del cuerpo. [place] y Photographer torso contra el suelo mientras eleva lentamente la cabeza y los hombros del suelo. No exagere con los ejercicios, especialmente en el comienzo. Los ejercicios pueden causar alguna molestia leve en la espalda durante algunos minutos; sin embargo, si el dolor es muy intenso, o dura ms de 15 minutos, no siga con la actividad fsica hasta que consulte al profesional que lo asiste. Los problemas en la espalda mejoran de Crown lenta con esta terapia.  Consulte al profesional para que lo ayude a planificar un programa de ejercicios adecuado para su espalda. Document Released: 10/18/2005 Document Revised: 01/10/2012 [facility] Patient Information 2015 [place]. This information is not intended to replace advice given to you by your health care provider. Make sure you discuss any questions you have with your health care provider. Citica  (Sciatica)  La citica es Conservation officer, historic buildings, debilidad, entumecimiento u hormigueo a lo largo del nervio citico. El nervio comienza en la zona inferior de la espalda y desciende por la parte posterior de cada pierna. El nervio controla los msculos de la parte inferior de la pierna y de la zona posterior de la rodilla, y transmite la sensibilidad a la parte posterior del muslo, la pierna y la planta del pie. La citica es un sntoma de otras afecciones mdicas. Por ejemplo, un dao a los nervios o algunas enfermedades como un disco herniado o un espoln seo en la columna vertebral, podran daarle o presionar en  el nervio citico. Esto causa dolor, debilidad y otras sensaciones normalmente asociadas con la citica. Generalmente la citica afecta slo un lado del cuerpo. CAUSAS   Disco herniado o desplazado.  Enfermedad degenerativa del disco.  Un sndrome doloroso que compromete un msculo angosto de los glteos (sndrome piriforme).  Lesin o  fractura plvica.  Embarazo.  Tumor (casos raros). SNTOMAS  Los sntomas pueden variar de leves a muy graves. Por lo general, los sntomas descienden desde la zona lumbar a las nalgas y la parte posterior de la pierna. Ellos son:   Hormigueo leve o dolor sordo en la parte inferior de la espalda, la pierna o la cadera.  Adormecimiento en la parte posterior de la pantorrilla o la planta del pie.  Sensacin de [employer] zona lumbar, la pierna o la cadera.  Dolor agudo en la zona inferior de la espalda, la pierna o la cadera.  Debilidad en las piernas.  Dolor de espalda intenso que [employer] movimientos. Los sntomas pueden empeorar al toser, Brewing technologist, rer o estar sentado o parado durante [employer]. [place]ems, el sobrepeso puede empeorar los sntomas.  DIAGNSTICO  Su mdico le har un examen fsico para buscar los sntomas comunes de la citica. Le pedir que haga algunos movimientos o actividades que activaran el dolor del nervio citico. Para encontrar las causas de la citica podr indicarle otros estudios. Estos pueden ser:   Anlisis de [place].  Radiografas.  Pruebas de diagnstico por imgenes, como resonancia magntica o tomografa computada. TRATAMIENTO  El tratamiento se dirige a las causas de la citica. A veces, el tratamiento no es necesario, y Conservation officer, historic buildings y Health and safety inspector desaparecen por s mismos. Si necesita tratamiento, su mdico puede sugerir:   Medicamentos de venta libre para Best boy.  Medicamentos recetados, como antiinflamatorios, relajantes musculares o narcticos.  Aplicacin de calor o hielo en la zona del dolor.  Inyecciones de corticoides para disminuir el dolor, la irritacin y la inflamacin alrededor del nervio.  Reduccin de la [employer] perodos de [place].  Ejercicios y estiramiento del abdomen para fortalecer y Teacher, English as a foreign language la flexibilidad de la columna vertebral. Su mdico puede sugerirle perder peso si el peso extra empeora el  dolor de espalda.  Fisioterapia.  La ciruga para eliminar lo que presiona o pincha el nervio, como un espoln seo o parte de una hernia de disco. INSTRUCCIONES PARA EL CUIDADO EN EL HOGAR   Slo tome medicamentos de venta libre o recetados para Glass blower/designer o Health and safety inspector, segn las indicaciones de su mdico.  Aplique hielo sobre el rea dolorida durante 20 minutos 3-4 veces por da durante los primeras 48-72 horas. Luego intente aplicar calor de la misma manera.  Haga ejercicios, elongue o realice sus actividades habituales, si no le causan ms dolor.  Cumpla con todas las sesiones de fisioterapia, segn le indique su mdico.  Cumpla con todas las visitas de control, segn le indique su mdico.  No use tacones altos o zapatos que no tengan buen apoyo.  Verifique que el colchn no sea muy blando. Un colchn firme Best boy y las [place]. SOLICITE ATENCIN MDICA DE INMEDIATO SI:   Pierde el control de la vejiga o del intestino (incontinencia).  Aumenta la debilidad en la zona inferior de la espalda, la pelvis, las nalgas o las piernas.  Siente irritacin o inflamacin en la espalda.  Tiene sensacin de ardor al [employer].  El dolor empeora cuando se acuesta o lo despierta por la noche.  El dolor es peor del que experiment en el pasado.  Dura ms de 4 semanas.  Pierde peso sin motivo de [place] sbita. ASEGRESE DE QUE:   Comprende estas instrucciones.  Controlar su enfermedad.  Solicitar ayuda de inmediato si no mejora o si empeora. Document Released: 10/18/2005 Document Revised: 04/18/2012 [facility] Patient Information 2015 [place], [place]. This information is not intended to replace advice given to you by your health care provider. Make sure you discuss any questions you have with your health care provider.

## 2015-07-21 NOTE — ED Notes (Signed)
Patient complains of back pain from neck to lower spine, radiates down right leg onset one month ago. Pt ambulatory with some difficulty. Denies problems with bowel or bladder.

## 2015-07-21 NOTE — ED Provider Notes (Signed)
CSN: [id number]     Arrival date & time 07/21/15  1354 History  This chart was scribed for non-physician practitioner, Everlene Farrier, PA-C working with Allen Nay, MD by Placido Sou, ED scribe. This patient was seen in room WTR8/WTR8 and the patient's care was started at 4:35 PM.   Chief Complaint  Patient presents with  . Back Pain   The history is provided by the patient. No language interpreter was used.    HPI Comments: Allen Lowery is a 54 y.o. male with a hx of DM who presents to the Emergency Department complaining of constant, moderate, radiating, right lower back pain with onset 1 month ago. Pt was recently seen at [facility] for the same symptoms, confirms taking his prescribed medications and has returned to the ED due to no alleviation of his symptoms. Pt notes a radiation of pain down his right buttocks and right posterior leg to his right knee. He confirms taking his metformin as prescribed but denies checking his blood glucose levels at home. Pt denies a hx of IVDA and further denies sitting for long periods of time while at work. He denies any trauma or falls. Pt denies any bowel or bladder incontinence, hematuria, dysuria, numbness, tingling, abd pain, n/v, fevers and chills.   Past Medical History  Diagnosis Date  . Diabetes mellitus without complication    History reviewed. No pertinent past surgical history. History reviewed. No pertinent family history. Social History  Substance Use Topics  . Smoking status: Never Smoker   . Smokeless tobacco: None  . Alcohol Use: No    Review of Systems  Constitutional: Negative for fever and chills.  Gastrointestinal: Negative for nausea, vomiting and abdominal pain.  Genitourinary: Negative for dysuria, hematuria and difficulty urinating.  Musculoskeletal: Positive for myalgias and back pain. Negative for neck pain.  Skin: Negative for rash and wound.  Neurological: Negative for weakness, light-headedness and  numbness.   Allergies  Review of patient's allergies indicates no known allergies.  Home Medications   Prior to Admission medications   Medication Sig Start Date End Date Taking? Authorizing Joli Koob  metFORMIN (GLUCOPHAGE) 500 MG tablet Take 1 tablet (500 mg total) by mouth 2 (two) times daily with a meal. 07/18/15   Trixie Dredge, PA-C  methocarbamol (ROBAXIN) 500 MG tablet Take 1 tablet (500 mg total) by mouth 2 (two) times daily as needed for muscle spasms. 07/21/15   Everlene Farrier, PA-C  naproxen (NAPROSYN) 250 MG tablet Take 1 tablet (250 mg total) by mouth 2 (two) times daily with a meal. 07/21/15   Everlene Farrier, PA-C  nystatin ointment (MYCOSTATIN) Apply topically 3 (three) times daily. 07/25/13   Bryan R Hess, DO   BP 127/81 mmHg  Pulse 69  Temp(Src) 97.7 F (36.5 C) (Oral)  Resp 18  SpO2 98% Physical Exam  Constitutional: He is oriented to person, place, and time. He appears well-developed and well-nourished. No distress.  Nontoxic appearing.  HENT:  Head: Normocephalic and atraumatic.  Eyes: Conjunctivae are normal. Pupils are equal, round, and reactive to light. Right eye exhibits no discharge. Left eye exhibits no discharge.  Neck: Normal range of motion. Neck supple.  No midline neck tenderness.  Cardiovascular: Normal rate, regular rhythm, normal heart sounds and intact distal pulses.   Pulmonary/Chest: Effort normal and breath sounds normal. No respiratory distress. He has no wheezes. He has no rales.  Abdominal: Soft. There is no tenderness.  Musculoskeletal: He exhibits tenderness. He exhibits no edema.  TTP  of right lower back; no midline neck or back tenderness; no back edema, ecchymosis or erythema; sensation intact of all extremities.  Patient is able to ambulate with antalgic gait. No lower extremity edema or tenderness.  Lymphadenopathy:    He has no cervical adenopathy.  Neurological: He is alert and oriented to person, place, and time. He has normal reflexes.  He displays normal reflexes. Coordination normal.  Sensation is intact his bilateral upper and lower extremities. He is able to ambulate with antalgic gait.  Skin: Skin is warm and dry. No rash noted. He is not diaphoretic. No erythema. No pallor.  Psychiatric: He has a normal mood and affect. His behavior is normal.  Nursing note and vitals reviewed.  ED Course  Procedures  DIAGNOSTIC STUDIES: Oxygen Saturation is 98% on RA, normal by my interpretation.    COORDINATION OF CARE: 4:43 PM Discussed treatment plan with pt at bedside and pt agreed to plan.  Labs Review Labs Reviewed - No data to display  Imaging Review No results found.    EKG Interpretation None      Filed Vitals:   07/21/15 1430  BP: 127/81  Pulse: 69  Temp: 97.7 F (36.5 C)  TempSrc: Oral  Resp: 18  SpO2: 98%     MDM   Meds given in ED:  Medications - No data to display  New Prescriptions   METHOCARBAMOL (ROBAXIN) 500 MG TABLET    Take 1 tablet (500 mg total) by mouth 2 (two) times daily as needed for muscle spasms.   NAPROXEN (NAPROSYN) 250 MG TABLET    Take 1 tablet (250 mg total) by mouth 2 (two) times daily with a meal.    Final diagnoses:  Right-sided low back pain with right-sided sciatica   Patient with right back pain and sciatica.  No neurological deficits and normal neuro exam.  Patient can walk but states is painful.  No loss of bowel or bladder control.  No concern for cauda equina.  No fever, night sweats, weight loss, h/o cancer, IVDU.  RICE protocol and pain medicine indicated and discussed with patient. I strongly encouraged the patient to follow-up with primary care for possible further treatment of his back pain including gabapentin and management of his diabetes. I advised the patient to follow-up with their primary care Allen Lowery this week. I advised the patient to return to the emergency department with new or worsening symptoms or new concerns. The patient verbalized understanding  and agreement with plan.    I personally performed the services described in this documentation, which was scribed in my presence. The recorded information has been reviewed and is accurate.       Everlene Farrier, PA-C 07/21/15 1654  Allen Nay, MD 07/21/15 [phone]

## 2015-08-04 ENCOUNTER — Emergency Department (INDEPENDENT_AMBULATORY_CARE_PROVIDER_SITE_OTHER)
Admission: EM | Admit: 2015-08-04 | Discharge: 2015-08-04 | Disposition: A | Discharge status: Nursing Home (Includes ICF) | Payer: No Typology Code available for payment source | Source: Home / Self Care | Attending: Family Medicine | Admitting: Family Medicine

## 2015-08-04 ENCOUNTER — Encounter (HOSPITAL_COMMUNITY): Payer: Self-pay | Admitting: *Deleted

## 2015-08-04 ENCOUNTER — Emergency Department (HOSPITAL_COMMUNITY)
Admission: EM | Admit: 2015-08-04 | Discharge: 2015-08-05 | Disposition: A | Discharge status: Home / Self Care | Payer: No Typology Code available for payment source | Attending: Emergency Medicine | Admitting: Emergency Medicine

## 2015-08-04 ENCOUNTER — Emergency Department (HOSPITAL_COMMUNITY): Payer: No Typology Code available for payment source

## 2015-08-04 ENCOUNTER — Emergency Department (INDEPENDENT_AMBULATORY_CARE_PROVIDER_SITE_OTHER): Payer: No Typology Code available for payment source

## 2015-08-04 ENCOUNTER — Encounter (HOSPITAL_COMMUNITY): Payer: Self-pay | Admitting: Emergency Medicine

## 2015-08-04 DIAGNOSIS — M5441 Lumbago with sciatica, right side: Secondary | ICD-10-CM

## 2015-08-04 DIAGNOSIS — Z791 Long term (current) use of non-steroidal anti-inflammatories (NSAID): Secondary | ICD-10-CM | POA: Insufficient documentation

## 2015-08-04 DIAGNOSIS — Z79899 Other long term (current) drug therapy: Secondary | ICD-10-CM | POA: Insufficient documentation

## 2015-08-04 DIAGNOSIS — M5136 Other intervertebral disc degeneration, lumbar region: Secondary | ICD-10-CM | POA: Insufficient documentation

## 2015-08-04 DIAGNOSIS — M5137 Other intervertebral disc degeneration, lumbosacral region: Secondary | ICD-10-CM

## 2015-08-04 DIAGNOSIS — M549 Dorsalgia, unspecified: Secondary | ICD-10-CM

## 2015-08-04 MED ORDER — OXYCODONE-ACETAMINOPHEN 5-325 MG PO TABS
2.0000 | ORAL_TABLET | Freq: Once | ORAL | Status: AC
  Administered 2015-08-04: 2 via ORAL
  Filled 2015-08-04: qty 2

## 2015-08-04 NOTE — ED Notes (Signed)
Pt  Reports  Back  Pain   With  symptoms   X     2  Months       denys  Any     specefic    Injury       Pt       Is  Diabetic     Has   No  pcp    -  He  Takes  Metformin        He  Reports  Nausea  He  Ambulates  With a   Slow  gait

## 2015-08-04 NOTE — ED Notes (Signed)
Pt reports R lower back pain which he has been evaluated for before. No improvement with meds. Pt reports numbness in bilateral legs and weakness upon standing.

## 2015-08-04 NOTE — ED Notes (Signed)
Pt reports that he was sent here for X-rays from urgent care.  He reports that his back has been causing him pain for two months and now it is causing him problems walking.

## 2015-08-04 NOTE — ED Provider Notes (Addendum)
CSN: [id number]     Arrival date & time 08/04/15  1416 History   First MD Initiated Contact with Patient 08/04/15 1640     Chief Complaint  Patient presents with  . Back Pain   (Consider location/radiation/quality/duration/timing/severity/associated sxs/prior Treatment) Patient is a 54 y.o. male presenting with back pain. The history is provided by the patient.  Back Pain Location:  Lumbar spine Quality:  Shooting Radiates to:  L posterior upper leg, R posterior upper leg and R foot Pain severity:  Moderate Onset quality:  Sudden (awoke with pain at onset, NKI, has been seen at [facility] and [facility] ER and given meds without relief.) Progression:  Worsening Chronicity:  Recurrent Ineffective treatments:  Muscle relaxants Associated symptoms: leg pain, numbness and weakness   Associated symptoms: no abdominal pain, no abdominal swelling, no dysuria and no fever     History reviewed. No pertinent past medical history. Past Surgical History  Procedure Laterality Date  . Kidney stone surgery     History reviewed. No pertinent family history. Social History  Substance Use Topics  . Smoking status: Never Smoker   . Smokeless tobacco: None  . Alcohol Use: No    Review of Systems  Constitutional: Negative for fever.  Gastrointestinal: Negative.  Negative for abdominal pain.  Genitourinary: Negative.  Negative for dysuria.  Musculoskeletal: Positive for back pain and gait problem.  Skin: Negative.   Neurological: Positive for weakness and numbness.  All other systems reviewed and are negative.   Allergies  Aspirin  Home Medications   Prior to Admission medications   Medication Sig Start Date End Date Taking? Authorizing Provider  Albuterol Sulfate (PROAIR RESPICLICK) 108 (90 BASE) MCG/ACT AEPB Inhale 2 puffs into the lungs every 4 (four) hours as needed. 12/25/14   Harle Battiest, NP  metFORMIN (GLUCOPHAGE) 500 MG tablet Take 1 tablet (500 mg total) by mouth daily with  breakfast. 12/25/14   Harle Battiest, NP  predniSONE (DELTASONE) 20 MG tablet Take 2 tablets (40 mg total) by mouth daily. 12/25/14   Harle Battiest, NP   Meds Ordered and Administered this Visit  Medications - No data to display  BP 125/77 mmHg  Pulse 63  Temp(Src) 98 F (36.7 C) (Oral)  Resp 16  SpO2 100% No data found.   Physical Exam  Constitutional: He is oriented to person, place, and time. He appears well-developed and well-nourished.  Musculoskeletal: He exhibits tenderness.  Neurological: He is alert and oriented to person, place, and time. No cranial nerve deficit. Gait abnormal.  Weakness with getting out of chair, unable to heel or toe walk from pain and weakness.  Skin: Skin is warm and dry.  Nursing note and vitals reviewed.   ED Course  Procedures (including critical care time)  Labs Review Labs Reviewed - No data to display  Imaging Review Dg Lumbar Spine Complete  08/04/2015   CLINICAL DATA:  54 year old male with low back pain for 2 months. No known injury.  EXAM: LUMBAR SPINE - COMPLETE 4+ VIEW  COMPARISON:  None.  FINDINGS: Normal alignment of the lower thoracic and lumbar spine.  No pars defect detected.  Peripheral osteophyte L2-3 and L3-4 level.  L5-S1 mild disc space narrowing with inferior L5 endplate sclerosis.  Mild degenerative changes lower thoracic spine with anterior osteophyte T10-11 and T11-12.  IMPRESSION: L5-S1 mild disc space narrowing. Sclerosis of the L5 inferior endplate consistent with degenerative changes   Electronically Signed   By: Lacy Duverney M.D.   On: 08/04/2015  17:19   X-rays reviewed and report per radiologist.   Visual Acuity Review  Right Eye Distance:   Left Eye Distance:   Bilateral Distance:    Right Eye Near:   Left Eye Near:    Bilateral Near:         MDM  No diagnosis found. Sent for lbp eval, seen at [facility] and [place] already, pt with weakness on standing and walking , not responding to prev meds. C/o  numbness in legs.    Linna Hoff, MD 08/04/15 1610  Linna Hoff, MD 08/04/15 [phone]

## 2015-08-04 NOTE — ED Provider Notes (Signed)
CSN: [id number]     Arrival date & time 08/04/15  1802 History   First MD Initiated Contact with Patient 08/04/15 2153     Chief Complaint  Patient presents with  . Back Pain     (Consider location/radiation/quality/duration/timing/severity/associated sxs/prior Treatment) HPI Comments: 54 year old male with no significant past medical history presents to the emergency department for further evaluation of persistent low back pain. Patient was seen and evaluated on 07/22/2015 and prescribe naproxen and Robaxin. He has been taking these for symptoms, but reports persistently worsening pain in his low back. Patient feels pain radiating down his posterior right lower extremity. This has progressed to pain radiating down his bilateral lower extremities posteriorly. Patient states that pain is worse with walking for prolonged periods. He also states that he has difficulty transitioning as well as pivoting. With prolonged ambulation, patient states that he feels as though his legs become weak and he is prone to falling. He reports lower extremity paresthesias without numbness. No bowel or bladder incontinence. No history of fever or cancer. No reported IV drug use. Patient denies any direct trauma or injury to his back inciting his symptoms. No recent strenuous activity or heavy lifting.  Patient is a 54 y.o. male presenting with back pain. The history is provided by the patient. No language interpreter was used.  Back Pain Associated symptoms: no fever and no numbness     History reviewed. No pertinent past medical history. Past Surgical History  Procedure Laterality Date  . Kidney stone surgery     No family history on file. Social History  Substance Use Topics  . Smoking status: Never Smoker   . Smokeless tobacco: None  . Alcohol Use: No    Review of Systems  Constitutional: Negative for fever.  Gastrointestinal: Negative for vomiting and diarrhea.  Genitourinary:       Negative for  bowel/bladder incontinence  Musculoskeletal: Positive for back pain and gait problem.  Neurological: Negative for numbness.  All other systems reviewed and are negative.   Allergies  Aspirin  Home Medications   Prior to Admission medications   Medication Sig Start Date End Date Taking? Authorizing Provider  metFORMIN (GLUCOPHAGE) 500 MG tablet Take 1 tablet (500 mg total) by mouth daily with breakfast. Patient taking differently: Take 500 mg by mouth 2 (two) times daily with a meal.  12/25/14  Yes Harle Battiest, NP  methocarbamol (ROBAXIN) 500 MG tablet Take 500 mg by mouth 2 (two) times daily as needed for muscle spasms.   Yes Historical Provider, MD  naproxen (NAPROSYN) 250 MG tablet Take by mouth 2 (two) times daily with a meal.   Yes Historical Provider, MD  Albuterol Sulfate (PROAIR RESPICLICK) 108 (90 BASE) MCG/ACT AEPB Inhale 2 puffs into the lungs every 4 (four) hours as needed. Patient not taking: Reported on 08/04/2015 12/25/14   Harle Battiest, NP  predniSONE (DELTASONE) 20 MG tablet Take 2 tablets (40 mg total) by mouth daily. Patient not taking: Reported on 08/04/2015 12/25/14   Harle Battiest, NP   BP 116/70 mmHg  Pulse 56  Temp(Src) 98.5 F (36.9 C) (Oral)  Resp 20  SpO2 98%   Physical Exam  Constitutional: He is oriented to person, place, and time. He appears well-developed and well-nourished. No distress.  Nontoxic/nonseptic appearing  HENT:  Head: Normocephalic and atraumatic.  Eyes: Conjunctivae and EOM are normal. No scleral icterus.  Neck: Normal range of motion.  Cardiovascular: Normal rate, regular rhythm and intact distal pulses.   DP  and PT pulses 2+ bilaterally  Pulmonary/Chest: Effort normal. No respiratory distress.  Respirations even and unlabored  Musculoskeletal: Normal range of motion. He exhibits tenderness.  Tenderness to palpation to the lumbar midline at approximately L5/S1. No bony deformities, step-offs, or crepitus. Mild  tenderness to the right lumbar paraspinal muscles. Negative straight leg raise and crossed straight leg raise.  Neurological: He is alert and oriented to person, place, and time. He exhibits normal muscle tone. Coordination normal.  GCS 15. Patient able to wiggle all toes. Sensation to light touch intact in the bilateral lower extremities.  Skin: Skin is warm and dry. No rash noted. He is not diaphoretic. No erythema. No pallor.  Psychiatric: He has a normal mood and affect. His behavior is normal.  Nursing note and vitals reviewed.   ED Course  Procedures (including critical care time) Labs Review Labs Reviewed - No data to display  Imaging Review Dg Lumbar Spine Complete  08/04/2015   CLINICAL DATA:  54 year old male with low back pain for 2 months. No known injury.  EXAM: LUMBAR SPINE - COMPLETE 4+ VIEW  COMPARISON:  None.  FINDINGS: Normal alignment of the lower thoracic and lumbar spine.  No pars defect detected.  Peripheral osteophyte L2-3 and L3-4 level.  L5-S1 mild disc space narrowing with inferior L5 endplate sclerosis.  Mild degenerative changes lower thoracic spine with anterior osteophyte T10-11 and T11-12.  IMPRESSION: L5-S1 mild disc space narrowing. Sclerosis of the L5 inferior endplate consistent with degenerative changes   Electronically Signed   By: Lacy Duverney M.D.   On: 08/04/2015 17:19   Mr Lumbar Spine Wo Contrast  08/05/2015   CLINICAL DATA:  Initial evaluation for acute back pain area  EXAM: MRI LUMBAR SPINE WITHOUT CONTRAST  TECHNIQUE: Multiplanar, multisequence MR imaging of the lumbar spine was performed. No intravenous contrast was administered.  COMPARISON:  Prior radiograph from earlier the same day.  FINDINGS: For the purposes of this dictation, the lowest well-formed intervertebral disc spaces presumed to be the L5-S1 level, and there presumed to be 5 lumbar type vertebral bodies.  Vertebral bodies are normally aligned with preservation of the normal lumbar  lordosis. Vertebral body heights are maintained. Signal intensity within the vertebral body bone marrow is normal. No fracture or listhesis. No focal osseous lesion. No marrow edema.  Conus medullaris terminates at the inferior aspect of L2. Signal intensity within the visualized cord is normal.  Paraspinous soft tissues within normal limits.  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  T10-11: Broad-based posterior disc protrusion flattens the ventral thecal sac. Superimposed moderate bilateral facet arthrosis. There is mild canal stenosis. Foramina not well evaluated at this level.  T11-12: Diffuse annular disc bulge. Bilateral facet arthrosis. Resultant mild canal stenosis. Mild bilateral foraminal narrowing.  T12-L1:  Mild diffuse annular disc bulge without stenosis.  L1-2: No disc bulge or disc protrusion. Mild bilateral facet arthrosis. No significant stenosis.  L2-3: No disc bulge or disc protrusion. Mild facet arthrosis. No significant stenosis.  L3-4: Diffuse disc bulge with disc desiccation and intervertebral disc space narrowing. Associated annular fissure posteriorly. Moderate facet arthrosis with prominent fluid within the bilateral L3-4 facets. There is resultant moderate canal and lateral recess stenosis bilaterally. Moderate bilateral foraminal stenosis.  L4-5: Diffuse disc bulge with disc desiccation and mild intervertebral disc space narrowing. Mild ligamentous hypertrophy. Trace fluid within the bilateral L4-5 facets. No significant canal stenosis. Moderate left with mild to moderate right foraminal stenosis.  L5-S1: Diffuse  disc bulge with disc desiccation and intervertebral disc space narrowing. Lateral osteophytic endplate spurring. Superimposed mild facet arthrosis. No significant canal stenosis. There is fairly severe bilateral foraminal stenosis, left worse than right.  IMPRESSION: 1. Degenerative disc bulge and facet arthrosis at L5-S1 resulting in fairly severe bilateral  foraminal stenosis, left worse than right. 2. Degenerative disc bulge at L3-4 with superimposed facet disease resulting in moderate canal and bilateral foraminal stenosis. 3. Disc protrusions at T10-11 and T11-12 with resultant mild canal stenosis. 4. Additional degenerative spondylolysis as above. No severe canal stenosis or cord compression identified.   Electronically Signed   By: Rise Mu M.D.   On: 08/05/2015 00:32   I have personally reviewed and evaluated these images and lab results as part of my medical decision-making.   EKG Interpretation None      MDM   Final diagnoses:  Back pain    Patient is a 54 y/o male presenting to the emergency department from urgent care for further evaluation of low back pain, worsening 3 weeks. Patient reporting subjective weakness in his lower extremities, mostly with prolonged ambulation. Patient is neurovascularly intact today. No red flags or signs concerning for cauda equina. MRI was obtained which shows degenerative disc disease and severe bilateral foraminal stenosis at L5/S1.  Pain controlled in ED with percocet. Plan to refer to Neurosurgery for outpatient consultation. Will change pain management regimen to Prednisone course, Mobic daily, and Percocet PRN. MRI findings and return precautions discussed with the patient at bedside who verbalizes understanding. Patient stable for discharge at this time. Patient is agreeable to plan with no unaddressed concerns at time of discharge; VSS.   Filed Vitals:   08/04/15 2230 08/04/15 2245 08/04/15 2300 08/05/15 0107  BP: 126/77 113/70 116/70 117/77  Pulse: 57 56 56 61  Temp:    97.8 F (36.6 C)  TempSrc:    Oral  Resp:    16  SpO2: 100% 98% 98% 100%     Antony Madura, PA-C 08/05/15 1610  Elwin Mocha, MD 08/06/15 2322

## 2015-08-05 MED ORDER — MELOXICAM 7.5 MG PO TABS: 15.0000 mg | ORAL_TABLET | Freq: Every day | ORAL | Status: DC

## 2015-08-05 MED ORDER — PREDNISONE 20 MG PO TABS: 40.0000 mg | ORAL_TABLET | Freq: Every day | ORAL | Status: DC

## 2015-08-05 MED ORDER — OXYCODONE-ACETAMINOPHEN 5-325 MG PO TABS: 1.0000 | ORAL_TABLET | Freq: Four times a day (QID) | ORAL | Status: DC | PRN

## 2015-08-05 NOTE — Discharge Instructions (Signed)
Deje de tomar naproxen. Puede seguir tomando Robaxin segn sea necesario. Tome prednisona y meloxicam segn lo prescrito. Tome Percocet para Occupational hygienist. Seguimiento con la neurociruga tan pronto como le sea posible. Su resonancia magntica muestra que tiene una enfermedad degenerativa de los discos de la columna vertebral inferior. Volver al servicio de urgencias si se pierde la sensibilidad en las piernas, comenzar a Automotive engineer en s mismo, no puede caminar, o experimentar cualquiera de los sntomas enumerados a continuacin.  Stop taking Naproxen. You may continue taking Robaxin as needed. Take prednisone and Mobic as prescribed. Take Percocet for severe pain. Follow up with neurosurgery as soon as you are able. 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 Return to the ED if you lose sensation in your legs, begin to stool or urinate on yourself, are unable to walk, or experience any of the symptoms listed below.  Enfermedad Degenerativa del Disco  (Degenerative Disk Disease) La causa de la enfermedad degenerativa del disco son los cambios que se producen en las almohadillas de la columna vertebral (discos intervertebrales) a medida que avanza la edad. Los discos de la columna vertebral son discos blandos y compresibles localizados entre los huesos de la columna (vrtebras). Ellos actan como amortiguadores. La enfermedad degenerativa de disco puede afectar a toda la columna vertebral. Sin embargo, el cuello y la espalda baja son los ms afectados. Con el envejecimiento pueden ocurrir [employer] discos espinales, tales como:   Los discos de la columna vertebral pueden secarse y encogerse.  Puede haber pequeos desgarros en la membrana resistente que cubre el disco (anillo fibroso).  El espacio del disco puede volverse ms pequeo debido a la prdida de [place].  Puede ocurrir que haya crecimientos anormales en el hueso (espolones). Estos pueden  presionar las races nerviosas que salen del canal espinal y Programmer, multimedia.  El canal espinal se Doctor, hospital. CAUSAS  La enfermedad degenerativa de disco es un trastorno causado por los cambios que ocurren en los discos espinales con el envejecimiento. No se conoce la causa exacta, pero no hay una base gentica en muchos pacientes. Los cambios degenerativos pueden ocurrir debido a la prdida de lquido en el disco. Esto hace que el disco sea ms delgado y reduce el espacio entre los huesos de la columna vertebral. Pueden aparecer pequeas grietas en la capa exterior del disco. Esto puede producir la ruptura del disco. Hay ms probabilidades de sufrir una enfermedad degenerativa del disco si tiene sobrepeso. Fumar cigarrillos y Education officer, environmental trabajos pesados, como el levantamiento de pesas, tambin puede aumentar el riesgo de sufrir esta enfermedad. Los cambios degenerativos pueden comenzar despus de una lesin repentina. El crecimiento de espolones seos puede comprimir las races nerviosas y Programmer, multimedia.  SNTOMAS  Pueden variar de Neomia Dear persona a otra. Algunas personas pueden no sentir Scientist, research (medical), mientras que otras sienten dolor intenso. El dolor puede ser tan intenso que puede limitar sus [place]. La localizacin del dolor depende de la parte de la columna vertebral afectada. Sentir dolor en el cuello o en el brazo si hay un disco afectado en la zona del cuello. Sentir Radiographer, therapeutic espalda, las nalgas o las piernas si hay un disco afectado en la cintura. El dolor se agrava al doblarse, levantarse o con los movimientos de torsin. El dolor puede comenzar gradualmente y despus empeorar con el [place]. Tambin puede comenzar despus de una lesin mayor o menor. Puede sentir adormecimiento u hormigueo en  los brazos o en las piernas.  DIAGNSTICO  El Barrister's clerk acerca de sus sntomas y de las actividades o hbitos que le causan Chief Technology Officer. Tambin podr preguntar acerca de lesiones, enfermedades  o tratamientos anteriores. El mdico lo examinar para comprobar el rango de movimientos en la zona afectada, la fuerza en las extremidades y la sensibilidad en las reas de los brazos y las piernas a las que llegan las diferentes races nerviosas. Puede tomarle una radiografa de la columna vertebral. El mdico puede sugerir otras pruebas de diagnstico por imgenes, como resonancia magntica (MRI), si es necesario.  TRATAMIENTO  El tratamiento consiste en hacer reposo, modificar las actividades y la aplicacin de hielo y Company secretary. El mdico podr recetar medicamentos para Primary school teacher y pedirle que haga algunos ejercicios para fortalecer la espalda. En algunos casos podra necesitar [place]. Usted y el mdico decidirn qu tratamiento es el mejor para usted.  INSTRUCCIONES PARA EL CUIDADO EN EL HOGAR   Siga las tcnicas apropiadas para levantar objetos y Advertising account planner, segn le indique su mdico.  Mantenga una buena [place].  Haga ejercicio con regularidad segn lo aconsejado.  Haga ejercicios de relajacin.  Cambie sus hbitos para sentarse, estar de pie y los hbitos de sueo segn las indicaciones. Cambie de posicin con frecuencia.  Baje de peso segn lo aconsejado.  Si fuma, abandone el hbito.  Use calzado de apoyo. SOLICITE ATENCIN MDICA SI:  El dolor no desaparece en 1 a 4 semanas.  SOLICITE ATENCIN MDICA DE INMEDIATO SI:   El dolor es intenso.  Siente debilidad [employer], en las manos o en las piernas.  Comienza a perder el control de la vejiga o los movimientos intestinales. ASEGRESE DE QUE:   Comprende estas instrucciones.  Controlar su enfermedad.  Solicitar ayuda de inmediato si no mejora o si empeora. Document Released: 02/03/2009 Document Revised: 01/10/2012 [facility] Patient Information 2015 [place], [place]. This information is not intended to replace advice given to you by your health care provider. Make sure you discuss any questions you have with  your health care provider.

## 2015-08-18 ENCOUNTER — Ambulatory Visit: Payer: Self-pay | Admitting: Internal Medicine

## 2019-05-28 ENCOUNTER — Ambulatory Visit (INDEPENDENT_AMBULATORY_CARE_PROVIDER_SITE_OTHER): Payer: Self-pay | Admitting: Primary Care

## 2019-05-28 ENCOUNTER — Other Ambulatory Visit: Payer: Self-pay

## 2019-05-28 ENCOUNTER — Encounter (INDEPENDENT_AMBULATORY_CARE_PROVIDER_SITE_OTHER): Payer: Self-pay | Admitting: Primary Care

## 2019-05-28 VITALS — Ht 66.0 in

## 2019-05-28 DIAGNOSIS — Z7689 Persons encountering health services in other specified circumstances: Secondary | ICD-10-CM

## 2019-05-28 DIAGNOSIS — R252 Cramp and spasm: Secondary | ICD-10-CM

## 2019-05-28 DIAGNOSIS — E119 Type 2 diabetes mellitus without complications: Secondary | ICD-10-CM

## 2019-05-28 NOTE — Progress Notes (Signed)
Virtual Visit via Telephone Note  I connected with Allen Lowery on 05/28/19 at  2:50 PM EDT by telephone and verified that I am speaking with the correct person using two identifiers.   I discussed the limitations, risks, security and privacy concerns of performing an evaluation and management service by telephone and the availability of in person appointments. I also discussed with the patient that there may be a patient responsible charge related to this service. The patient expressed understanding and agreed to proceed.   History of Present Illness: Allen Lowery is establishing care his chief complaint is cramps - hands and below the knee down and night sweats. Mechanic with changing and fixing tires.   Past Medical History:  Diagnosis Date  . Diabetes mellitus without complication ([place])     Observations/Objective: Review of Systems  Respiratory:       Night sweats  Musculoskeletal:       Cramps in hands and below the knee   Assessment and Plan: Allen Lowery was seen today for new patient (initial visit).  Diagnoses and all orders for this visit:  Encounter to establish care No primary care or health maintenance  -     CBC with Differential/Platelet; Future -     Comprehensive metabolic panel; Future  Cramp and spasm Patient states he is a diabetic and has not been on medication . Repetative motions verus neuropathy  -     Comprehensive metabolic panel; Future  Type 2 diabetes mellitus without complication, without long-term current use of insulin ([place]) ADA recommends the following therapeutic goals for glycemic control related to A1c measurements: Goal of therapy: Less than 6.5 hemoglobin A1c.  Reference clinical practice recommendations. Foods that are high in carbohydrates are the following rice, potatoes, breads, sugars, and pastas.  Reduction in the intake (eating) will assist in lowering your blood sugars.    Follow Up Instructions:    I  discussed the assessment and treatment plan with the patient. The patient was provided an opportunity to ask questions and all were answered. The patient agreed with the plan and demonstrated an understanding of the instructions.   The patient was advised to call back or seek an in-person evaluation if the symptoms worsen or if the condition fails to improve as anticipated.  I provided 26 minutes of non-face-to-face time during this encounter.   Kerin Perna, NP

## 2020-02-02 ENCOUNTER — Ambulatory Visit: Payer: Self-pay | Attending: Internal Medicine

## 2020-02-02 DIAGNOSIS — Z23 Encounter for immunization: Secondary | ICD-10-CM

## 2020-02-02 NOTE — Progress Notes (Signed)
   Covid-19 Vaccination Clinic  Name:  Allen Lowery    MRN: [id number] DOB: [birth year]  02/02/2020  Mr. [name] was observed post Covid-19 immunization for 15 minutes without incident. He was provided with Vaccine Information Sheet and instruction to access the V-Safe system.   Mr. [name] was instructed to call 911 with any severe reactions post vaccine: Marland Kitchen Difficulty breathing  . Swelling of face and throat  . A fast heartbeat  . A bad rash all over body  . Dizziness and weakness   Immunizations Administered    Name Date Dose VIS Date Route   Pfizer COVID-19 Vaccine 02/02/2020  4:04 PM 0.3 mL 10/12/2019 Intramuscular   Manufacturer: [employer], [employer]   Lot: [id number]   NDC: [address]-1

## 2020-02-26 ENCOUNTER — Ambulatory Visit: Payer: Self-pay | Attending: Internal Medicine

## 2020-02-26 DIAGNOSIS — Z23 Encounter for immunization: Secondary | ICD-10-CM

## 2020-02-26 NOTE — Progress Notes (Signed)
   Covid-19 Vaccination Clinic  Name:  Allen Lowery    MRN: [id number] DOB: June 04, 1961  02/26/2020  [name] was observed post Covid-19 immunization for 15 minutes without incident. He was provided with Vaccine Information Sheet and instruction to access the V-Safe system.   [name] was instructed to call 911 with any severe reactions post vaccine: Marland Kitchen Difficulty breathing  . Swelling of face and throat  . A fast heartbeat  . A bad rash all over body  . Dizziness and weakness   Immunizations Administered    Name Date Dose VIS Date Route   Pfizer COVID-19 Vaccine 02/26/2020  4:01 PM 0.3 mL 12/26/2018 Intramuscular   Manufacturer: [employer], [employer]   Lot: [id number]   NDC: [address]-0

## 2020-03-14 ENCOUNTER — Observation Stay (HOSPITAL_COMMUNITY)
Admission: EM | Admit: 2020-03-14 | Discharge: 2020-03-16 | Disposition: A | Discharge status: Home / Self Care | Payer: Self-pay | Attending: Internal Medicine | Admitting: Internal Medicine

## 2020-03-14 ENCOUNTER — Encounter (HOSPITAL_COMMUNITY): Payer: Self-pay

## 2020-03-14 ENCOUNTER — Emergency Department (HOSPITAL_COMMUNITY): Payer: Self-pay

## 2020-03-14 DIAGNOSIS — E119 Type 2 diabetes mellitus without complications: Secondary | ICD-10-CM | POA: Insufficient documentation

## 2020-03-14 DIAGNOSIS — G7000 Myasthenia gravis without (acute) exacerbation: Secondary | ICD-10-CM | POA: Diagnosis present

## 2020-03-14 DIAGNOSIS — Z20822 Contact with and (suspected) exposure to covid-19: Secondary | ICD-10-CM | POA: Insufficient documentation

## 2020-03-14 DIAGNOSIS — G510 Bell's palsy: Principal | ICD-10-CM

## 2020-03-14 LAB — DIFFERENTIAL
Abs Immature Granulocytes: 0.02 10*3/uL (ref 0.00–0.07)
Basophils Absolute: 0.1 10*3/uL (ref 0.0–0.1)
Basophils Relative: 1 %
Eosinophils Absolute: 0.3 10*3/uL (ref 0.0–0.5)
Eosinophils Relative: 4 %
Immature Granulocytes: 0 %
Lymphocytes Relative: 29 %
Lymphs Abs: 2.2 10*3/uL (ref 0.7–4.0)
Monocytes Absolute: 0.6 10*3/uL (ref 0.1–1.0)
Monocytes Relative: 7 %
Neutro Abs: 4.4 10*3/uL (ref 1.7–7.7)
Neutrophils Relative %: 59 %

## 2020-03-14 LAB — COMPREHENSIVE METABOLIC PANEL
ALT: 27 U/L (ref 0–44)
AST: 19 U/L (ref 15–41)
Albumin: 3.7 g/dL (ref 3.5–5.0)
Alkaline Phosphatase: 91 U/L (ref 38–126)
Anion gap: 11 (ref 5–15)
BUN: 20 mg/dL (ref 6–20)
CO2: 22 mmol/L (ref 22–32)
Calcium: 9.3 mg/dL (ref 8.9–10.3)
Chloride: 100 mmol/L (ref 98–111)
Creatinine, Ser: 0.78 mg/dL (ref 0.61–1.24)
GFR calc Af Amer: >60 mL/min (ref >60)
GFR calc non Af Amer: >60 mL/min (ref >60)
Glucose, Bld: 457 mg/dL — ABNORMAL HIGH (ref 70–99)
Potassium: 4.4 mmol/L (ref 3.5–5.1)
Sodium: 133 mmol/L — ABNORMAL LOW (ref 135–145)
Total Bilirubin: 0.6 mg/dL (ref 0.3–1.2)
Total Protein: 6.5 g/dL (ref 6.5–8.1)

## 2020-03-14 LAB — CBC
HCT: 43.7 % (ref 39.0–52.0)
Hemoglobin: 15 g/dL (ref 13.0–17.0)
MCH: 30.4 pg (ref 26.0–34.0)
MCHC: 34.3 g/dL (ref 30.0–36.0)
MCV: 88.5 fL (ref 80.0–100.0)
Platelets: 296 10*3/uL (ref 150–400)
RBC: 4.94 MIL/uL (ref 4.22–5.81)
RDW: 12.1 % (ref 11.5–15.5)
WBC: 7.5 10*3/uL (ref 4.0–10.5)
nRBC: 0 % (ref 0.0–0.2)

## 2020-03-14 LAB — PROTIME-INR
INR: 1 (ref 0.8–1.2)
Prothrombin Time: 12.3 seconds (ref 11.4–15.2)

## 2020-03-14 LAB — APTT: aPTT: 30 seconds (ref 24–36)

## 2020-03-14 MED ORDER — SODIUM CHLORIDE 0.9% FLUSH: 3.0000 mL | Freq: Once | INTRAVENOUS | Status: DC

## 2020-03-14 NOTE — ED Triage Notes (Addendum)
Pt arrives to ED w/ c/o right sided facial droop and right sided facial numbness. Pt also endorses intermittent R sided headache. LKW 1400 today. No unilateral weakness, vision changes, slurred speech, aphasia. Pt AOx4 able to answer all questions appropriately in triage.

## 2020-03-15 DIAGNOSIS — G510 Bell's palsy: Secondary | ICD-10-CM

## 2020-03-15 DIAGNOSIS — G7000 Myasthenia gravis without (acute) exacerbation: Secondary | ICD-10-CM | POA: Diagnosis present

## 2020-03-15 LAB — CBG MONITORING, ED
Glucose-Capillary: 270 mg/dL — ABNORMAL HIGH (ref 70–99)
Glucose-Capillary: 216 mg/dL — ABNORMAL HIGH (ref 70–99)
Glucose-Capillary: 318 mg/dL — ABNORMAL HIGH (ref 70–99)

## 2020-03-15 LAB — HEMOGLOBIN A1C
Hgb A1c MFr Bld: 12.9 % — ABNORMAL HIGH (ref 4.8–5.6)
Mean Plasma Glucose: 323.53 mg/dL

## 2020-03-15 LAB — GLUCOSE, CAPILLARY: Glucose-Capillary: 295 mg/dL — ABNORMAL HIGH (ref 70–99)

## 2020-03-15 LAB — SARS CORONAVIRUS 2 BY RT PCR (HOSPITAL ORDER, PERFORMED IN ~~LOC~~ HOSPITAL LAB): SARS Coronavirus 2: NEGATIVE

## 2020-03-15 LAB — HIV ANTIBODY (ROUTINE TESTING W REFLEX): HIV Screen 4th Generation wRfx: NONREACTIVE

## 2020-03-15 MED ORDER — INSULIN ASPART 100 UNIT/ML ~~LOC~~ SOLN
0.0000 [IU] | Freq: Every day | SUBCUTANEOUS | Status: DC
  Administered 2020-03-15: 3 [IU] via SUBCUTANEOUS

## 2020-03-15 MED ORDER — ENOXAPARIN SODIUM 40 MG/0.4ML ~~LOC~~ SOLN
40.0000 mg | Freq: Every day | SUBCUTANEOUS | Status: DC
  Administered 2020-03-15 – 2020-03-16 (×2): 40 mg via SUBCUTANEOUS
  Filled 2020-03-15 (×2): qty 0.4

## 2020-03-15 MED ORDER — INSULIN GLARGINE 100 UNIT/ML ~~LOC~~ SOLN
18.0000 [IU] | Freq: Every day | SUBCUTANEOUS | Status: DC
  Administered 2020-03-15: 18 [IU] via SUBCUTANEOUS
  Filled 2020-03-15: qty 0.18

## 2020-03-15 MED ORDER — KETOROLAC TROMETHAMINE 60 MG/2ML IM SOLN
30.0000 mg | Freq: Once | INTRAMUSCULAR | Status: AC
  Administered 2020-03-15: 30 mg via INTRAMUSCULAR
  Filled 2020-03-15: qty 2

## 2020-03-15 MED ORDER — INSULIN ASPART 100 UNIT/ML ~~LOC~~ SOLN
0.0000 [IU] | Freq: Three times a day (TID) | SUBCUTANEOUS | Status: DC
  Administered 2020-03-15: 7 [IU] via SUBCUTANEOUS
  Administered 2020-03-15: 15 [IU] via SUBCUTANEOUS
  Administered 2020-03-16: 4 [IU] via SUBCUTANEOUS
  Administered 2020-03-16: 11 [IU] via SUBCUTANEOUS
  Administered 2020-03-16: 7 [IU] via SUBCUTANEOUS

## 2020-03-15 MED ORDER — INSULIN ASPART 100 UNIT/ML ~~LOC~~ SOLN
6.0000 [IU] | Freq: Three times a day (TID) | SUBCUTANEOUS | Status: DC
  Administered 2020-03-15 – 2020-03-16 (×5): 6 [IU] via SUBCUTANEOUS

## 2020-03-15 MED ORDER — PYRIDOSTIGMINE BROMIDE 60 MG PO TABS: 30.0000 mg | ORAL_TABLET | Freq: Three times a day (TID) | ORAL | Status: DC

## 2020-03-15 MED ORDER — DIPHENHYDRAMINE HCL 50 MG/ML IJ SOLN
12.5000 mg | Freq: Once | INTRAMUSCULAR | Status: AC
  Administered 2020-03-15: 12.5 mg via INTRAVENOUS
  Filled 2020-03-15: qty 1

## 2020-03-15 MED ORDER — METOCLOPRAMIDE HCL 5 MG/ML IJ SOLN
10.0000 mg | Freq: Once | INTRAMUSCULAR | Status: AC
  Administered 2020-03-15: 10 mg via INTRAVENOUS
  Filled 2020-03-15: qty 2

## 2020-03-15 MED ORDER — PYRIDOSTIGMINE BROMIDE 60 MG PO TABS
60.0000 mg | ORAL_TABLET | Freq: Every day | ORAL | Status: DC
  Filled 2020-03-15: qty 1

## 2020-03-15 MED ORDER — ACETAMINOPHEN 325 MG PO TABS
650.0000 mg | ORAL_TABLET | Freq: Four times a day (QID) | ORAL | Status: DC | PRN
  Administered 2020-03-15 – 2020-03-16 (×2): 650 mg via ORAL
  Filled 2020-03-15 (×2): qty 2

## 2020-03-15 MED ORDER — PREDNISONE 20 MG PO TABS
60.0000 mg | ORAL_TABLET | Freq: Every day | ORAL | Status: DC
  Administered 2020-03-15 – 2020-03-16 (×2): 60 mg via ORAL
  Filled 2020-03-15 (×2): qty 3

## 2020-03-15 NOTE — ED Notes (Signed)
Attempted report x1. 

## 2020-03-15 NOTE — H&P (Signed)
Date: 03/15/2020               Patient Name:  Allen Lowery MRN: [id number]  DOB: 08-10-1961 Age / Sex: 59 y.o., male   PCP: Kerin Perna, NP         Medical Service: Internal Medicine Teaching Service         Attending Physician: Dr. Sid Falcon, MD    First Contact: Dr. Gilford Rile Pager: [phone]  Second Contact: Dr. Koleen Distance Pager: [phone]       After Hours (After 5p/  First Contact Pager: [phone]  weekends / holidays): Second Contact Pager: [phone]   Chief Complaint: Right Sided Facial Weakness  History of Present Illness:  [name] is a 59 y/o male, with a PMH of DM, who presents to the [facility] with complaints of dizziness and R sided facial weakness. He states that he did have light headedness two weeks preceding his facial weakness. He states that the right headedness is worsened by turning his head to look around and over his shoulder, and denies presence when standing. Patient states that his facial weakness started 3-4 days ago and started with his left eye. He has noticed progressive weakness of the mouth, lips, and eyes over the past 72 hours. Patient has not had a prior event. Patient states that he does have difficulty keeping food and liquids in his mouth due to not being able to keep his lips closed, but swallows without difficulty. He does endorse some slurring with his speech.   He additionally complains of an intermittent headache on the R side, it is pulsatile in nature and rates a 10/10 in pain which quickly alleviates to a 1/10. Nothing appears to aggravate headache, and patient attempted tylenol without relief. He denies fevers, facial paraesthesias, visual changes, dysphagia, odynophagia, nausea, vomiting, dyspnea, SHOB, chest pain, upper and lower extremity weakness, urinary/bowel incontinence. Denies history of chickenpox, shingles, flu.    Meds:  No outpatient medications have been marked as taking for the 03/14/20 encounter  [facility] Encounter).  OTC Tylenol    Allergies: Allergies as of 03/14/2020  . (No Known Allergies)   Past Medical History:  Diagnosis Date  . Diabetes mellitus without complication ([place])     Family History:  DM: None HTN: None  Cancer: None   Social History:  - Lives at home with family - Tobacco: Denies - ETOH: Denies - Drugs: Denies  Review of Systems: A complete ROS was negative except as per HPI.   Physical Exam: Blood pressure 125/74, pulse 81, temperature 98.9 F (37.2 C), temperature source Oral, resp. rate 18, SpO2 97 %. Physical Exam Constitutional:      General: He is not in acute distress.    Appearance: Normal appearance. He is not ill-appearing or toxic-appearing.  HENT:     Head: Normocephalic and atraumatic.     Mouth/Throat:     Mouth: Mucous membranes are moist.     Pharynx: No oropharyngeal exudate or posterior oropharyngeal erythema.  Eyes:     General:        Right eye: No discharge.        Left eye: No discharge.     Extraocular Movements: Extraocular movements intact.  Cardiovascular:     Rate and Rhythm: Normal rate and regular rhythm.     Pulses: Normal pulses.     Heart sounds: Normal heart sounds. No murmur. No friction rub. No gallop.   Pulmonary:  Effort: Pulmonary effort is normal.     Breath sounds: Normal breath sounds. No wheezing, rhonchi or rales.  Abdominal:     General: Abdomen is flat. Bowel sounds are normal.  Musculoskeletal:        General: Normal range of motion.     Right lower leg: No edema.     Left lower leg: No edema.     Comments: 5/5 strength in the upper and lower extremities Bilaterally.   Skin:    General: Skin is warm.  Neurological:     Mental Status: He is alert and oriented to person, place, and time.     Comments: CNII: Visual acuity intact  CN III,IV,VI: Patient able to fully open eyelids bilaterally, EOM present bilaterally, pupillary reflex intact bilaterally  CN V: Facial sensation intact  bilaterally with no pain elicited on palpation.   CN VII: Patient unable to keep R side eyelid closed, asymmetric style on right side, unable to fully raise R eyebrow  CN IX/X: Palate symmetrically elevates  CN XI: Full and equal strength on lateral head rotation, and shoulder shrug intact bilaterally.   CN XII: Tongue protrusion symmetrical with no deviation      EKG: personally reviewed my interpretation is Normal sinus rhythm, indeterminate anterior infarct.    EXAM: CT HEAD WITHOUT CONTRAST  TECHNIQUE: Contiguous axial images were obtained from the base of the skull through the vertex without intravenous contrast.  COMPARISON:  None.  FINDINGS: Brain: No acute intracranial abnormality. Specifically, no hemorrhage, hydrocephalus, mass lesion, acute infarction, or significant intracranial injury.  Vascular: No hyperdense vessel or unexpected calcification.  Skull: No acute calvarial abnormality.  Sinuses/Orbits: Mucosal thickening.  No acute findings.  Other: None  IMPRESSION: No acute intracranial abnormality.  Assessment & Plan by Problem: Principal Problem:   Bell's palsy  Allen Lowery is a 59 y/o male, with a PMH of DM, who presents with R sided facial weakness and admitted for Bell's Palsy.  Bell's Palsy:  Patient presents to the ED with 3 day Hx of R sided facial paralysis concerning for Bell's Palsy. Patient does not endorse previous viral illness like chicken pox, or herpes. Additionally, with intermittent pulsatile pain that rates 10/10 then quickly drops to 1/10 is also concerning for trigeminal neuralgia. No pain to palpation, or hypersensitivity to touch appreciated on physical examination.  - Prednisone 60 mg QD  - Cardiac Monitoring - NIH Stroke Scale - Continuous pulse ox - Vital Signs  DM:  - SSI  - Monitor CBG - HS coverage    Dispo: Admit patient to Observation with expected length of stay less than 2  midnights.  Signed: Dolan Amen, MD 03/15/2020, 6:04 PM  Pager: [phone]

## 2020-03-15 NOTE — ED Notes (Signed)
Lunch tray ordered 

## 2020-03-15 NOTE — ED Provider Notes (Signed)
[facility] EMERGENCY DEPARTMENT Provider Note   CSN: [id number] Arrival date & time: 03/14/20  1843     History Chief Complaint  Patient presents with  . Stroke Symptoms    Allen Lowery is a 59 y.o. male with pertinent past medical history of diabetes that presents emergency department for right-sided facial droop, headache that started yesterday.  Patient is Spanish-speaking and daughter and interpreter were used.  Patient states that he first started having a right-sided headache about 3 days ago, describes it as intermittent sharp and pulsating.  States that his headache is a 10 and then will drop down to a 1.  Not exertional or positional.  States that it is just over his right ear and eye.  States that he has never had migraines before.  Patient and daughter noticed that at 2:00 yesterday they noticed him having a right-sided facial droop, patient states that water has been leaking from that side of the mouth every time he brushes his teeth and experiences drooling.  Triage note states that he has right-sided facial numbness, however when speaking to the patient about this he denies it.  Patient's daughter says that she noticed some slurred speech yesterday, however after speaking to the patient more about this it was not described as aphasia but more slurred speech due to his droop.  He denies any weakness, sensation changes, memory changes, gait abnormality, chest pain, shortness of breath, abdominal pain, nausea, vomiting, abnormal stools, fevers, chills.  Patient states that he has been experiencing some blurry vision in his right eye since yesterday.  Patient has never had a stroke, denies any clotting disorders, denies any medical history.  Patient states that he does not take any medications including no diabetes medications.  Daughter states that she has not noticed any altered mental state or confusion with the patient.  Has tried Tylenol for the headache  without much relief.  HPI     Past Medical History:  Diagnosis Date  . Diabetes mellitus without complication [facility])     Patient Active Problem List   Diagnosis Date Noted  . Bell's palsy 03/15/2020    History reviewed. No pertinent surgical history.     No family history on file.  Social History   Tobacco Use  . Smoking status: Never Smoker  . Smokeless tobacco: Never Used  Substance Use Topics  . Alcohol use: No  . Drug use: No    Home Medications Prior to Admission medications   Not on File    Allergies    Patient has no known allergies.  Review of Systems   Review of Systems  Constitutional: Negative for chills, diaphoresis, fatigue and fever.  HENT: Negative for congestion, sore throat and trouble swallowing.   Eyes: Negative for pain and visual disturbance.  Respiratory: Negative for cough, shortness of breath and wheezing.   Cardiovascular: Negative for chest pain, palpitations and leg swelling.  Gastrointestinal: Negative for abdominal distention, abdominal pain, diarrhea, nausea and vomiting.  Genitourinary: Negative for difficulty urinating.  Musculoskeletal: Negative for back pain, neck pain and neck stiffness.  Skin: Negative for pallor.  Neurological: Positive for facial asymmetry and headaches. Negative for dizziness, speech difficulty and weakness.  Psychiatric/Behavioral: Negative for confusion.    Physical Exam Updated Vital Signs BP 125/74 (BP Location: Right Arm)   Pulse 81   Temp 98.9 F (37.2 C) (Oral)   Resp 18   SpO2 97%   Physical Exam Constitutional:  General: He is not in acute distress.    Appearance: Normal appearance. He is not ill-appearing, toxic-appearing or diaphoretic.  HENT:     Mouth/Throat:     Mouth: Mucous membranes are moist.     Pharynx: Oropharynx is clear.  Eyes:     General: No visual field deficit or scleral icterus.    Extraocular Movements: Extraocular movements intact.     Pupils: Pupils are  equal, round, and reactive to light.  Cardiovascular:     Rate and Rhythm: Normal rate and regular rhythm.     Pulses: Normal pulses.     Heart sounds: Normal heart sounds.  Pulmonary:     Effort: Pulmonary effort is normal. No respiratory distress.     Breath sounds: Normal breath sounds. No stridor. No wheezing, rhonchi or rales.  Chest:     Chest wall: No tenderness.  Abdominal:     General: Abdomen is flat. There is no distension.     Palpations: Abdomen is soft.     Tenderness: There is no abdominal tenderness. There is no guarding or rebound.  Musculoskeletal:        General: No swelling or tenderness. Normal range of motion.     Cervical back: Normal range of motion and neck supple. No rigidity.     Right lower leg: No edema.     Left lower leg: No edema.  Skin:    General: Skin is warm and dry.     Capillary Refill: Capillary refill takes less than 2 seconds.     Coloration: Skin is not pale.  Neurological:     Mental Status: He is alert and oriented to person, place, and time.     Cranial Nerves: Facial asymmetry present. No cranial nerve deficit or dysarthria.     Sensory: Sensation is intact.     Motor: Weakness present.     Coordination: Coordination is intact. Romberg sign negative. Finger-Nose-Finger Test normal. Rapid alternating movements normal.     Gait: Gait is intact.     Deep Tendon Reflexes:     Reflex Scores:      Patellar reflexes are 2+ on the right side and 2+ on the left side.    Comments: Patient is unable to fully smile on right side due to muscle weakness.  Patient is unable to fully close his eye tight on right side.  Muscle weakness also noted on right-sided forehead, patient is unable to fully elevate eyebrow, no eyebrow creases on right side while compared to left side.  No weakness noted anywhere else besides right sided face.  Normal sensation compared to left side of the face.  Normal strength and sensation elsewhere on the body.  No visual field  deficits, peripheral vision intact.  Coordination normal with negative Romberg, normal finger-to-nose testing.  Gait steady.  All other cranial nerves intact except for the ones described above.  Psychiatric:        Mood and Affect: Mood normal.        Behavior: Behavior normal.     ED Results / Procedures / Treatments   Labs (all labs ordered are listed, but only abnormal results are displayed) Labs Reviewed  COMPREHENSIVE METABOLIC PANEL - Abnormal; Notable for the following components:      Result Value   Sodium 133 (*)    Glucose, Bld 457 (*)    All other components within normal limits  HEMOGLOBIN A1C - Abnormal; Notable for the following components:   Hgb A1c MFr  Bld 12.9 (*)    All other components within normal limits  CBG MONITORING, ED - Abnormal; Notable for the following components:   Glucose-Capillary 270 (*)    All other components within normal limits  CBG MONITORING, ED - Abnormal; Notable for the following components:   Glucose-Capillary 216 (*)    All other components within normal limits  CBG MONITORING, ED - Abnormal; Notable for the following components:   Glucose-Capillary 318 (*)    All other components within normal limits  SARS CORONAVIRUS 2 BY RT PCR (HOSPITAL ORDER, [place] [facility])  PROTIME-INR  APTT  CBC  DIFFERENTIAL  HIV ANTIBODY (ROUTINE TESTING W REFLEX)    EKG EKG Interpretation  Date/Time:  Friday Mar 14 2020 18:44:26 EDT Ventricular Rate:  94 PR Interval:  162 QRS Duration: 74 QT Interval:  338 QTC Calculation: 422 R Axis:   1 Text Interpretation: Normal sinus rhythm Anterior infarct , age undetermined Abnormal ECG When compared to prior, faster rate. No STEMI Confirmed by Antony Blackbird [phone]) on 03/15/2020 7:50:49 AM   Radiology CT HEAD WO CONTRAST  Result Date: 03/14/2020 CLINICAL DATA:  Right facial droop and facial numbness. EXAM: CT HEAD WITHOUT CONTRAST TECHNIQUE: Contiguous axial images were obtained  from the base of the skull through the vertex without intravenous contrast. COMPARISON:  None. FINDINGS: Brain: No acute intracranial abnormality. Specifically, no hemorrhage, hydrocephalus, mass lesion, acute infarction, or significant intracranial injury. Vascular: No hyperdense vessel or unexpected calcification. Skull: No acute calvarial abnormality. Sinuses/Orbits: Mucosal thickening.  No acute findings. Other: None IMPRESSION: No acute intracranial abnormality. Electronically Signed   By: Rolm Baptise M.D.   On: 03/14/2020 19:41    Procedures Procedures (including critical care time)  Medications Ordered in ED Medications  sodium chloride flush (NS) 0.9 % injection 3 mL (has no administration in time range)  enoxaparin (LOVENOX) injection 40 mg (40 mg Subcutaneous Given 03/15/20 1306)  insulin aspart (novoLOG) injection 0-20 Units (15 Units Subcutaneous Given 03/15/20 1823)  insulin aspart (novoLOG) injection 0-5 Units (has no administration in time range)  insulin aspart (novoLOG) injection 6 Units (6 Units Subcutaneous Given 03/15/20 1823)  predniSONE (DELTASONE) tablet 60 mg (60 mg Oral Given 03/15/20 1305)  insulin glargine (LANTUS) injection 18 Units (has no administration in time range)  metoCLOPramide (REGLAN) injection 10 mg (10 mg Intravenous Given 03/15/20 0951)  diphenhydrAMINE (BENADRYL) injection 12.5 mg (12.5 mg Intravenous Given 03/15/20 0950)  ketorolac (TORADOL) injection 30 mg (30 mg Intramuscular Given 03/15/20 0947)    ED Course  I have reviewed the triage vital signs and the nursing notes.  Pertinent labs & imaging results that were available during my care of the patient were reviewed by me and considered in my medical decision making (see chart for details).    MDM Rules/Calculators/A&P                     DEMETRE MONACO is a 59 y.o. male with pertinent past medical history of diabetes that presents emergency department for right-sided facial droop, headache  that started yesterday.Physical exam with findings suggestive of Bell's palsy. CT negative for stroke.   Metoclopramide, Benadryl, Toradol given for HA. After revaluation pt states feels better. HA sounds like could be a migraine.   We will consult hospitalist due to need for prednisone for Bell's palsy, glucose was 458. Pt has never taken anything for DM.  Repeat CBG 270. Worried if discharge will come back in DKA.  Blurry vision in right eye most likely due to patient not being able to blink due to muscle weakness.  8:22 PM discussed case with hospitalist, Dr. Chesley Mires with [facility] who agrees to accept care of patient.  The patient appears reasonably stabilized for admission considering the current resources, flow, and capabilities available in the ED at this time, and I doubt any other [facility] requiring further screening and/or treatment in the ED prior to admission.  .I discussed this case with my attending physician who cosigned this note including patient's presenting symptoms, physical exam, and planned diagnostics and interventions. Attending physician stated agreement with plan or made changes to plan which were implemented.   Attending physician assessed patient at bedside.   Final Clinical Impression(s) / ED Diagnoses Final diagnoses:  Bell's palsy    Rx / DC Orders ED Discharge Orders    None       Farrel Gordon, PA-C 03/15/20 2025    Tegeler, Canary Brim, MD 03/16/20 [phone]

## 2020-03-16 DIAGNOSIS — G510 Bell's palsy: Secondary | ICD-10-CM

## 2020-03-16 DIAGNOSIS — G7000 Myasthenia gravis without (acute) exacerbation: Secondary | ICD-10-CM

## 2020-03-16 LAB — GLUCOSE, CAPILLARY
Glucose-Capillary: 220 mg/dL — ABNORMAL HIGH (ref 70–99)
Glucose-Capillary: 163 mg/dL — ABNORMAL HIGH (ref 70–99)
Glucose-Capillary: 281 mg/dL — ABNORMAL HIGH (ref 70–99)

## 2020-03-16 MED ORDER — INSULIN GLARGINE 100 UNIT/ML ~~LOC~~ SOLN
20.0000 [IU] | Freq: Every day | SUBCUTANEOUS | Status: DC
  Filled 2020-03-16: qty 0.2

## 2020-03-16 MED ORDER — INSULIN REGULAR HUMAN 100 UNIT/ML IJ SOLN
18.0000 [IU] | Freq: Two times a day (BID) | INTRAMUSCULAR | 0 refills | Status: DC
Start: 2020-03-16 — End: 2020-04-08

## 2020-03-16 MED ORDER — INSULIN STARTER KIT- SYRINGES (SPANISH)
1.0000 | Freq: Once | Status: AC
  Administered 2020-03-16: 1
  Filled 2020-03-16: qty 1

## 2020-03-16 MED ORDER — LIVING WELL WITH DIABETES BOOK - IN SPANISH
Freq: Once | Status: AC
  Filled 2020-03-16: qty 1

## 2020-03-16 MED ORDER — ASPIRIN-ACETAMINOPHEN-CAFFEINE 250-250-65 MG PO TABS
2.0000 | ORAL_TABLET | Freq: Four times a day (QID) | ORAL | Status: DC | PRN
  Administered 2020-03-16: 2 via ORAL
  Filled 2020-03-16 (×2): qty 2

## 2020-03-16 MED ORDER — PREDNISONE 20 MG PO TABS: 60.0000 mg | ORAL_TABLET | Freq: Every day | ORAL | 0 refills | Status: DC

## 2020-03-16 NOTE — Evaluation (Signed)
Physical Therapy Evaluation and Discharge Patient Details Name: Allen Lowery MRN: [id number] DOB: 01-Dec-1960 Today's Date: 03/16/2020   History of Present Illness  [name] is a 59 year old man with PMH of DM who presented 03/15/20 with dizziness and R sided facial weakness with difficulty keeping food in his mouth and difficulty keeping his right eye open.  Further noted slurring of speech, right sided headache. CT head negative. Working diagnosis Bell's palsy. RN noted rt sided weakness this a.m. with MD ordering PT  Clinical Impression   Patient evaluated by Physical Therapy with no further acute PT needs identified. No difference in rt vs lt strength noted. Gait WNL and pt reports is at his baseline. Can do rhomberg eyes closed for at least 15 sec WNL.  PT is signing off. Thank you for this referral.     Follow Up Recommendations No PT follow up    Equipment Recommendations  None recommended by PT    Recommendations for Other Services       Precautions / Restrictions Precautions Precautions: None      Mobility  Bed Mobility                  Transfers Overall transfer level: Independent                  Ambulation/Gait Ambulation/Gait assistance: Independent Gait Distance (Feet): 150 Feet Assistive device: None Gait Pattern/deviations: WFL(Within Functional Limits)   Gait velocity interpretation: 1.31 - 2.62 ft/sec, indicative of limited community ambulator General Gait Details: pt wearing hospital socks, slightly slow due to no shoes per pt  Stairs            Wheelchair Mobility    Modified Rankin (Stroke Patients Only)       Balance Overall balance assessment: Independent                       Rhomberg - Eyes Opened: 15 Rhomberg - Eyes Closed: 15                 Pertinent Vitals/Pain Pain Assessment: No/denies pain    Home Living                        Prior Function Level of Independence:  Independent               Hand Dominance        Extremity/Trunk Assessment   Upper Extremity Assessment Upper Extremity Assessment: Overall WFL for tasks assessed    Lower Extremity Assessment Lower Extremity Assessment: Overall WFL for tasks assessed(bil knee extension and ankle DF symmetrical)       Communication   Communication: Prefers language other than [place];Interpreter utilized(virtual interpreter, Larena Glassman)  Cognition Arousal/Alertness: Awake/alert Behavior During Therapy: WFL for tasks assessed/performed Overall Cognitive Status: Within Functional Limits for tasks assessed                                        General Comments      Exercises     Assessment/Plan    PT Assessment Patent does not need any further PT services  PT Problem List         PT Treatment Interventions      PT Goals (Current goals can be found in the Care Plan section)  Acute Rehab PT Goals PT Goal  Formulation: All assessment and education complete, DC therapy    Frequency     Barriers to discharge        Co-evaluation               AM-PAC PT "6 Clicks" Mobility  Outcome Measure Help needed turning from your back to your side while in a flat bed without using bedrails?: None Help needed moving from lying on your back to sitting on the side of a flat bed without using bedrails?: None Help needed moving to and from a bed to a chair (including a wheelchair)?: None Help needed standing up from a chair using your arms (e.g., wheelchair or bedside chair)?: None Help needed to walk in hospital room?: None Help needed climbing 3-5 steps with a railing? : None 6 Click Score: 24    End of Session Equipment Utilized During Treatment: Gait belt Activity Tolerance: Patient tolerated treatment well Patient left: in chair;with call bell/phone within reach Nurse Communication: Mobility status;Other (comment)(independent; no difference strength; Ok to [employer] per  PT) PT Visit Diagnosis: Muscle weakness (generalized) (M62.81)    Time: 0370-9643 PT Time Calculation (min) (ACUTE ONLY): 10 min   Charges:   PT Evaluation $PT Eval Low Complexity: 1 Low           Jerolyn Center, PT Pager [phone]   Zena Amos 03/16/2020, 3:55 PM

## 2020-03-16 NOTE — Progress Notes (Signed)
Discharge summary reviewed with patient/family. All questions answered at this time. Transport provided by family.  Sim Boast, RN

## 2020-03-16 NOTE — Progress Notes (Signed)
Education provided with teach back, education material provided. 1. Diabetes 2. Low and high blood sugar symptoms 3. How to self inject insulin  Post education: able to reiterate information regarding low and high blood sugar sx. Pt able to draw insulin using insulin syringe and self inject insulin. Family at bedside. All questions answered.

## 2020-03-16 NOTE — Discharge Instructions (Signed)
Al se[place], [place] un placer trabajar con ustedes durante su estada en [place]. Est siendo tratado por su debilidad facial. Contine tomando 3 comprimidos de prednisona al da. Adems, se le orden NOVOLIN. Inyecte 18 [employer] al da antes de las comidas. Lo seguirs en la clnica IMC. Programaremos una cita para usted y lo llamaremos para programarla. Regrese si sus sntomas empeoran.    Parlisis facial en los adultos Bell Palsy, Adult  La parlisis facial es una incapacidad a corto plazo para mover los msculos de una parte del rostro. La incapacidad del movimiento (parlisis) es el resultado de la inflamacin o compresin del nervio facial, que recorre el crneo y la parte inferior de las orejas hacia el costado del rostro (sptimo nervio craneal). Este nervio es responsable de los movimientos faciales, que incluyen parpadear, cerrar los ojos, sonrer y Development worker, community. Cules son las causas? Se desconoce la causa exacta de esta afeccin. Puede ser causada por una infeccin de un virus, como el virus de la varicela (herpes zster), de Epstein-Barr o de las paperas. Qu incrementa el riesgo? Es ms probable que usted sufra esta afeccin si:  Est embarazada.  Tiene diabetes.  Recientemente ha tenido una infeccin en la nariz, la garganta o las vas respiratorias (infeccin de las vas respiratorias superiores).  Tiene debilitado el sistema de defensa del organismo (sistema inmunitario).  Tuvo una lesin facial, como una fractura.  Tiene antecedentes familiares de parlisis facial. Cules son los signos o los sntomas? Los sntomas de esta afeccin [employer] siguientes:  Debilidad en un lado del rostro.  Cada del prpado y de la comisura de la boca.  Exceso de lagrimeo en uno de los ojos.  Dificultad para cerrar el prpado.  Venita [place].  Sequedad en la boca.  Cambios en el sentido del gusto.  Cambio en el aspecto  facial.  Dolor detrs de [place].  Zumbido en una o ambas orejas.  Sensibilidad al ruido en [place].  Contraccin facial.  Dolor de cabeza.  Trastornos del habla.  Mareos.  Dificultad para comer o beber. La mayor parte del [place], solo una parte del rostro se ve afectada. Rara vez, la parlisis facial afecta todo el rostro. Cmo se diagnostica? Esta afeccin se diagnostica en funcin de lo siguiente:  Sus sntomas.  Sus antecedentes mdicos.  Un examen fsico. Es posible que, adems, deba consultar a un especialista en trastornos de los nervios (neurlogo) o enfermedades y afecciones oculares (oftalmlogo). Pueden hacerle estudios, por ejemplo:  Una prueba para controlar si hubo dao nervioso (electromiograma).  Estudios de diagnstico por imgenes, como exploracin por tomografa computarizada (TC) o Health visitor (RM).  Anlisis de [place]. Cmo se trata? Esta afeccin afecta a cada persona de un modo diferente. A veces, los sntomas desaparecen sin tratamiento en el plazo de unas semanas. Si el tratamiento es necesario, vara de persona a Social worker. El objetivo del tratamiento es reducir la inflamacin y proteger los ojos para que no se daen. El tratamiento de la parlisis facial puede incluir:  Medicamentos, por ejemplo: ? Corticoesteroides para reducir la hinchazn y la inflamacin. ? Medicamentos antivirales. ? Analgsicos, como aspirina, paracetamol o ibuprofeno.  Gotas oftlmicas o ungento para [employer].  Proteccin para los ojos, si no puede cerrar el ojo.  Ejercicios o masajes para recuperar la fuerza y la funcin del msculo (fisioterapia). Siga estas indicaciones en su casa:   [employer] medicamentos de venta libre y los  recetados solamente como se lo haya indicado el mdico.  Si el ojo est afectado: ? Aplique el ungento o las gotas oftlmicas para Theatre manager los ojos humectados tal como se lo haya indicado el  mdico. ? Siga las indicaciones del mdico para el cuidado y Health visitor de los ojos como se lo haya indicado el mdico.  Haga los ejercicios de fisioterapia como se lo haya indicado el mdico.  Concurra a todas las visitas de control como se lo haya indicado el mdico. Esto es importante. Comunquese con un mdico si:  Tiene fiebre.  Los sntomas no mejoran en un lapso de 2 a 3semanas, o empeoran.  El ojo est rojo, irritado o le duele.  Aparecen nuevos sntomas. Solicite ayuda de inmediato si:  Siente debilidad o adormecimiento en alguna parte del cuerpo que no sea el rostro.  Tiene dificultad para tragar.  Tiene dolor o rigidez en el cuello.  Tiene mareos o Risk manager. Resumen  La parlisis facial es una incapacidad a corto plazo para mover los msculos de una parte del rostro. La incapacidad para moverse (parlisis) es el resultado de la inflamacin o la compresin del nervio facial.  Esta afeccin afecta a cada persona de un modo diferente. A veces, los sntomas desaparecen sin tratamiento en el plazo de unas semanas.  Si el tratamiento es necesario, vara de persona a Nurse, learning disability. El objetivo del tratamiento es reducir la inflamacin y proteger los ojos para que no se daen.  Comunquese con el mdico si los sntomas no mejoran en el lapso de 2 a 3semanas, o empeoran. Esta informacin no tiene Marine scientist el consejo del mdico. Asegrese de hacerle al mdico cualquier pregunta que tenga. Document Revised: 01/27/2018 Document Reviewed: 05/09/2017 Elsevier Patient Education  [place].

## 2020-03-16 NOTE — Plan of Care (Signed)
  Problem: Education: Goal: Knowledge of General Education information will improve Description: Including pain rating scale, medication(s)/side effects and non-pharmacologic comfort measures Outcome: Progressing   Problem: Clinical Measurements: Goal: Ability to maintain clinical measurements within normal limits will improve Outcome: Progressing Goal: Will remain free from infection Outcome: Progressing   

## 2020-03-16 NOTE — Progress Notes (Signed)
Inpatient Diabetes Program Recommendations  AACE/ADA: New Consensus Statement on Inpatient Glycemic Control   Target Ranges:  Prepandial:   less than 140 mg/dL      Peak postprandial:   less than 180 mg/dL (1-2 hours)      Critically ill patients:  140 - 180 mg/dL  Results for Allen Lowery, Allen Lowery (MRN [id number]) as of 03/16/2020 10:38  Ref. Range 03/15/2020 09:34 03/15/2020 12:28 03/15/2020 17:23 03/15/2020 21:17 03/16/2020 06:06  Glucose-Capillary Latest Ref Range: 70 - 99 mg/dL 270 (H) 216 (H) 318 (H) 295 (H) 220 (H)   Results for Allen Lowery, Allen Lowery (MRN [id number]) as of 03/16/2020 10:38  Ref. Range 08/29/2009 10:20 11/24/2010 21:02 03/15/2020 14:00  Hemoglobin A1C Latest Ref Range: 4.8 - 5.6 % 16.2 (H) 5.8... (H) 12.9 (H)   Review of Glycemic Control  Diabetes history: DM2 Outpatient Diabetes medications: None Current orders for Inpatient glycemic control: Lantus 20 units QHS, Novolog 0-20 units TID with meals, Novolog 0-5 units QHS, Novolog 6 units TID with meals for meal coverage; Prednisone 60 mg QAM  Inpatient Diabetes Program Recommendations:    HbgA1C:  A1C 12.9% on 03/15/20 indicating an average glucose of 324 mg/dl over the past 2-3 months. Anticipate patient needs insulin as an outpatient for DM control.  If patient is discharged home on insulin, please prescribe affordable insulin such as NOVOLIN 70/30. Based on current insulin orders and glucose trends, would recommend NOVOLIN 70/30 18 units BID (would provide a total of 25.2 units for basal and 10.8 units for meal coverage per day).   NOTE: Noted consult for Diabetes Coordinator. Diabetes Coordinator is not on campus over the weekend but available by pager from 8am to 5pm for questions or concerns. Chart reviewed. Noted patient has DM2 hx and last office note by PCP Sylvester Harder, NP) was on 05/28/2019. Also noted prior A1C of 16.2% on 08/29/2009. Ordered Living Well with DM (Spanish), insulin starter kit (Spanish), and patient  education by bedside RNs. Have communicated with Kathlee Nations, RN to ask that patient be educated on insulin and allowed to self administer insulin in case he is discharged new to insulin. Called patient with Spanish interpreter [phone]. Patient states that he does not have a PCP and that he does not currently take any DM medications. Patient states he was dx with DM over 10 years ago and that he has taken Metformin in the past. Patient states he has never been on insulin in the past.  Patient does not have a glucometer. Patient does not know what an A1C is and does not know what normal glucose values should be. Explained what an A1C is and discussed A1C results (12.9% on 03/15/20 ) and explained that current A1C indicates an average glucose of 324 mg/dl over the past 2-3 months. Discussed glucose and A1C goals. Discussed importance of checking CBGs and maintaining good CBG control to prevent long-term and short-term complications. Explained how hyperglycemia leads to damage within blood vessels which lead to the common complications seen with uncontrolled diabetes. Stressed to the patient the importance of improving glycemic control to prevent further complications from uncontrolled diabetes. Discussed impact of nutrition, exercise, stress, sickness, and medications on diabetes control.  Patient does not follow any type of diet. Discussed carbohydrate modified diet, carbohydrate goals per day and meal, along with portion sizes. Encouraged patient to check glucose 3-4 times per day and to take DM medications as prescribed. Explained that may need insulin as an outpatient for DM control. Discussed affordable insulins (  NOVOLIN NPH, Regular, and 70/30) and informed patient that these insulins are $25 per vial or $43 per box of 5 insulin pens at [facility]. Patient states that would be affordable for him. Also encouraged patient to get Reli-On Prime glucometer $9 and a box of 50 test strips for $9 at [facility]. Explained that  based on the insulin he is currently ordered, he may need 70/30 insulin; discussed 70/30 insulin in detail. Informed patient that RN will be working with him on insulin administration and will also provide an insulin starter kit for him. Patient has Living Well with DM book; encouraged him to read the entire book. Discussed hypoglycemia along with treatment. Patient would like assistance with getting a follow up visit at a local clinic. Informed patient that TOC is consulted and should be assisting with arranging follow up and with medications. Patient verbalized understanding of information discussed and reports no further questions at this time related to diabetes.  Thanks, Barnie Alderman, RN, MSN, CDE Diabetes Coordinator Inpatient Diabetes Program [phone] (Team Pager)

## 2020-03-16 NOTE — Progress Notes (Signed)
During assessment, patient c/o right side headache toward the back of his head, he states tylenol is not hel;ping, pt with partial right facial droop. Right upper/lower ext weakness 4/5. Patient able to ambulate to bathroom with standby assist and appears with gait instability. MD on rounds notify of change. MDs will reevaluate.

## 2020-03-16 NOTE — Progress Notes (Signed)
   Subjective:  O/N Events: None   [name] was seen at bedside this morning. He is complaining of persistent headache that has worsened since yesterday. Describes it as a constant throbbing around his right ear.   He denies any recent viral symptoms or recent tick bites. No history of herpes infection or chicken pox as a child or shingles. No blisters on hands or feet.   Objective:  Vital signs in last 24 hours: Vitals:   03/15/20 1708 03/15/20 1722 03/16/20 0019 03/16/20 0548  BP: 112/69 125/74 120/83 128/80  Pulse: 79 81 77 72  Resp: 14 18 19 18   Temp: 98.3 F (36.8 C) 98.9 F (37.2 C) 98 F (36.7 C) (!) 97.4 F (36.3 C)  TempSrc: Oral Oral Oral Oral  SpO2: 96% 97% 96% 98%   Physical Exam Vitals reviewed.  HENT:     Right Ear: Tympanic membrane, ear canal and external ear normal. There is no impacted cerumen.     Left Ear: Tympanic membrane, ear canal and external ear normal. There is no impacted cerumen.     Nose: Nose normal.  Eyes:     Extraocular Movements: Extraocular movements intact.     Conjunctiva/sclera: Conjunctivae normal.     Pupils: Pupils are equal, round, and reactive to light.  Cardiovascular:     Rate and Rhythm: Normal rate and regular rhythm.     Pulses: Normal pulses.     Heart sounds: Normal heart sounds. No murmur. No friction rub. No gallop.   Pulmonary:     Effort: Pulmonary effort is normal.     Breath sounds: Normal breath sounds. No wheezing, rhonchi or rales.  Abdominal:     General: Bowel sounds are normal.     Tenderness: There is no abdominal tenderness. There is no guarding or rebound.  Musculoskeletal:     Comments: Strength 5/5 in the upper and lower extremities  Neurological:     Mental Status: He is alert and oriented to person, place, and time.     Comments: PERRL Tongue and uvula midline Loss of nasolabial fold on R side, unable to smile on the R side. Unable to keep R eyelid closed against resistance. Loss of forehead  wrinkle on R side.  Sensation intact bilaterally Shoulder shrug symmetric and full     Assessment/Plan:  Principal Problem:   Bell's palsy  Allen Lowery is a 59 y/o male, with a PMH of DM, who presents with R sided facial weakness and admitted for Bell's Palsy.  Bell's Palsy:  Patient presenting with symptoms concerning for Bell's Palsy, patient denies tick bites, or recent viral illness. Patient endorses headache and cool feet today, my be due to diabetic mononeuropathy. Will treat with Prednisone to cover for Bell's palsy and insulin for DM. Patient's nurse noted that patient had difficulty with gate. Will get PT Eval and treat.  - Prednisone 60 mg QD  - PT Eval and Treat, pending D/C based on recs  DM:  - Will provide samples from clinic - SSI  - Monitor CBG - HS coverage - Lantus 21U   Prior to Admission Living Arrangement: Home  Anticipated Discharge Location: Home Barriers to Discharge:Home Dispo: Anticipated discharge in approximately 1-2 day(s).   41, MD 03/16/2020, 6:10 AM Pager: [phone]

## 2020-03-17 ENCOUNTER — Telehealth: Payer: Self-pay

## 2020-03-17 MED FILL — predniSONE 20 MG TABS: 20 | 9 days supply | Qty: 27 | Fill #0

## 2020-03-17 MED FILL — NovoLIN R 100 UNIT/ML SOLN: 100 | 28 days supply | Qty: 10 | Fill #0

## 2020-03-17 NOTE — Discharge Summary (Signed)
   Name: Allen Lowery MRN: [id number] DOB: [birth year] 59 y.o. PCP: Grayce Sessions, NP  Date of Admission: 03/14/2020  6:48 PM Date of Discharge: 03/16/2020 Attending Physician: No att. providers found  Discharge Diagnosis: 1. Bell's Palsy 2. New Onset Diabetes  Discharge Medications: Allergies as of 03/16/2020   No Known Allergies     Medication List    TAKE these medications   insulin regular 100 units/mL injection Commonly known as: NOVOLIN R Inject 0.18 mLs (18 Units total) into the skin 2 (two) times daily before a meal.   predniSONE 20 MG tablet Commonly known as: DELTASONE Take 3 tablets (60 mg total) by mouth daily with breakfast.       Disposition and follow-up:   Mr.Allen Lowery was discharged from [facility] in Stable condition.  At the hospital follow up visit please address:  1.  Diabetes Management and Resolution of Bell's Palsy  2.  Labs / imaging needed at time of follow-up: CBG  3.  Pending labs/ test needing follow-up: None  Follow-up Appointments: Follow-up Information    [facility]. Schedule an appointment as soon as possible for a visit in 1 week(s).   Contact information: [address] [place] [address] [phone]          Hospital Course by problem list: 1. Bell's Palsy:  Patient presenting to the emergency department with a three day history of right sided facial paralysis, with intermittent pulsatile right sided headache. CT head was negative for intracranial pathology. Patient did not endorse viral illnesses, Herpes, or tick bites. He was started on prednisone 60 mg daily. He was discharged in stable condition with a 9 day course of prednisone. He was instructed to follow up in the Internal Medicine clinic.  2. New Onset Diabetes: Patient presented to the emergency department with elevated sugars and an A1c of 12.9%. He was managed at on sliding scale  insulin and 18 units of Lantus. He was discharged in in stable condition with NOVOLIN 70/30 and instructed to take 18 units two times daily and to follow up with the internal medicine clinic.    Discharge Vitals:   BP 132/82 (BP Location: Left Arm)   Pulse 78   Temp 99.2 F (37.3 C) (Oral)   Resp 18   SpO2 97%   Pertinent Labs, Studies, and Procedures:  CBG (last 3)  Recent Labs    03/16/20 0606 03/16/20 1108 03/16/20 1557  GLUCAP 220* 163* 281*    CBC Latest Ref Rng & Units 03/14/2020 07/18/2015 05/12/2013  WBC 4.0 - 10.5 K/uL 7.5 - 7.7  Hemoglobin 13.0 - 17.0 g/dL 50.3 54.6 56.8  Hematocrit 39.0 - 52.0 % 43.7 47.0 43.0  Platelets 150 - 400 K/uL 296 - 282    CT HEAD WO CONTRAST IMPRESSION: No acute intracranial abnormality.  Discharge Instructions: Discharge Instructions    Diet - low sodium heart healthy   Complete by: As directed    Increase activity slowly   Complete by: As directed       Signed: Dolan Amen, MD 03/17/2020, 8:21 PM   Pager: [phone]

## 2020-03-17 NOTE — Telephone Encounter (Signed)
Transition Care Management Follow-up Telephone Call  Date of discharge and from where: 03/15/2020 [facility]   Called pt unable to reach/ Left voice message to call back. sName and phone nr provided.

## 2020-03-18 ENCOUNTER — Telehealth: Payer: Self-pay

## 2020-03-18 NOTE — Telephone Encounter (Signed)
Transition Care Management Follow-up Telephone Call  Date of discharge and from where: 03/16/2020 from [facility]  How have you been since you were released from the hospital? Feeling better since start taking his medications.   Any questions or concerns? None   Functional Questionnaire: (I = Independent and D = Dependent) ADLs: I   Items Reviewed: Did the pt receive and understand the discharge instructions provided? YES Medications obtained and verified? YES  Any new allergies since your discharge? NO Dietary orders reviewed? Yes  Do you have support at home? None   Functional Questionnaire: (I = Independent and D = Dependent) ADLs: I   Follow up appointments reviewed:  PCP Hospital f/u appt confirmed?  Scheduled to see NP Leim Fabry on 03/25/2020 at 4:10pm  Specialist Hospital f/u appt confirmed? No  Are transportation arrangements needed? NO If their condition worsens, /is the pt aware to call PCP or go to the Emergency Dept.? Pt is aware if condition is worsening or start experiencing any of diff breathing,  SOB, chest pain, rapid weight gain, severe uncontrolled pain, or visual disturbances to return to ED. Was the patient provided with contact information for the PCP's office or ED? YES  Was to pt encouraged to call back with questions or concerns?YES name and contact information given.   Pt stated he is checking CBG regularly and last reading was 254 in the morning. Stressed the importance to better DM control  particularly when on steroids. Verbalized understanding

## 2020-03-25 ENCOUNTER — Telehealth (INDEPENDENT_AMBULATORY_CARE_PROVIDER_SITE_OTHER): Payer: Self-pay | Admitting: Primary Care

## 2020-04-01 ENCOUNTER — Ambulatory Visit: Payer: Self-pay

## 2020-04-01 ENCOUNTER — Other Ambulatory Visit: Payer: Self-pay

## 2020-04-01 ENCOUNTER — Ambulatory Visit (INDEPENDENT_AMBULATORY_CARE_PROVIDER_SITE_OTHER): Payer: Self-pay | Admitting: Internal Medicine

## 2020-04-01 VITALS — BP 126/82 | HR 81 | Temp 98.3°F | Ht 66.0 in | Wt 195.1 lb

## 2020-04-01 DIAGNOSIS — S00411A Abrasion of right ear, initial encounter: Secondary | ICD-10-CM

## 2020-04-01 DIAGNOSIS — Z794 Long term (current) use of insulin: Secondary | ICD-10-CM

## 2020-04-01 DIAGNOSIS — E119 Type 2 diabetes mellitus without complications: Secondary | ICD-10-CM | POA: Insufficient documentation

## 2020-04-01 DIAGNOSIS — H538 Other visual disturbances: Secondary | ICD-10-CM

## 2020-04-01 DIAGNOSIS — S00419A Abrasion of unspecified ear, initial encounter: Secondary | ICD-10-CM | POA: Insufficient documentation

## 2020-04-01 DIAGNOSIS — E1169 Type 2 diabetes mellitus with other specified complication: Secondary | ICD-10-CM

## 2020-04-01 DIAGNOSIS — G510 Bell's palsy: Secondary | ICD-10-CM

## 2020-04-01 DIAGNOSIS — X58XXXA Exposure to other specified factors, initial encounter: Secondary | ICD-10-CM

## 2020-04-01 LAB — C-REACTIVE PROTEIN: CRP: 0.7 mg/dL (ref <1.0)

## 2020-04-01 LAB — SEDIMENTATION RATE: Sed Rate: 2 mm/hr (ref 0–16)

## 2020-04-01 MED ORDER — PREDNISONE 20 MG PO TABS: 60.0000 mg | ORAL_TABLET | Freq: Every day | ORAL | 0 refills | Status: DC

## 2020-04-01 MED FILL — predniSONE 20 MG TABS: 20 | 9 days supply | Qty: 27 | Fill #0

## 2020-04-01 NOTE — Assessment & Plan Note (Addendum)
At the patient's hospitalization on 5/15-5/16, the patient was noted to have a hemoglobin A1c of 12.9.  Patient states that he had been told he had diabetes in the past but never took any medications for it.  He was discharged with regular insulin 18 units twice daily.  The patient's been taking this medication consistently. He states his blood sugars have averaged in the low 200s since he has been checking them.  He denies any polyuria or polydipsia at this time.  The patient is currently has Gwinda Passe, NP as his PCP.  -Continue regular insulin 18 units twice daily -We will follow up with the patient to see if he is followed up with his PCP as far as his diabetes management is concerned

## 2020-04-01 NOTE — Assessment & Plan Note (Addendum)
The patient was hospitalized on 5/15-5/16 after experiencing right-sided facial droop.  Head imaging was negative for CVA.  The patient was diagnosed with Bell's palsy at that time and presented here for follow-up.  The patient states that he continues to have right-sided facial weakness, particularly with chewing foods.  He frequently accidentally bites his lip because he does not feel like he has control over the right side of his mouth.  He is also had difficulty swallowing because of this.  Cranial nerve exam demonstrated asymmetric facial movement with a right-sided facial droop, but sensation was intact.  Otherwise, the patient had a normal neurologic exam.  Low suspicion for CVA.  The patient received a second dose of Pfizer Covid vaccine on 4/27 and asks if this is the cause of his Bell's palsy.  I explained to the patient that there has not been any connection between Bell's palsy in the Covid vaccines, and this is unlikely the cause of his condition.  Patient voiced understanding.  -I suspect the patient's facial droop will improve over the next several weeks to months -We will follow-up at next visit

## 2020-04-01 NOTE — Assessment & Plan Note (Addendum)
The patient states that he started experiencing blurred vision in his right eye, right-sided temporal headache, right jaw pain, and right shoulder pain roughly 2 weeks prior to his hospitalization.  Patient states his vision was the first thing he noticed, which was followed by the pain in his right shoulder ear and jaw. The patient states the symptoms improved after receiving the 9 days of prednisone 60 mg daily, but his symptoms got worse once he ran out of his own.  Today on exam, the patient does have 20/70 vision in bilateral eyes with his glasses on, reports subjective right eye blurriness compared to his left.  Given the constellation of symptoms, I am concerned of potential giant cell arteritis.  Patient did not have ESR CRP evaluated at his hospitalization -Order stat ESR and CRP -Restart prednisone 60 mg daily -Follow-up within 1 week

## 2020-04-01 NOTE — Assessment & Plan Note (Addendum)
Patient states that he has had ear pain with drainage out of his right ear that started around the same time as his facial weakness. The patient's ear has several well-healed appearing scabbed lesions on the external surface of the ear.  Otoscopic exam demonstrated normal canals and tympanic membrane.  No evidence of active infection could be appreciated.  The patient denies ever having chickenpox in the past or rashes in the other regions of his body.  He also denies any recent trauma to the ear. -I do not see any evidence of active infection, vesicles, or trauma to the ear.  The patient's lesions appear to be healing well and I do not think needs further intervention at this time. -Monitor next visit

## 2020-04-01 NOTE — Progress Notes (Signed)
   CC: Blurred vision and ear pain  HPI:  Allen Lowery is a spanish speaking  59 y.o.  M accompanied by his son in law with a history as noted below who presents today for hospital follow-up.  Please refer to the problem based charting for further details.  Past Medical History:  Diagnosis Date  . Diabetes mellitus without complication (HCC)    Review of Systems: All systems were reviewed and are otherwise negative unless mentioned in the HPI  Physical Exam:  Vitals:   04/01/20 0928  BP: 126/82  Pulse: 81  Temp: 98.3 F (36.8 C)  TempSrc: Oral  SpO2: 99%  Weight: 195 lb 1.6 oz (88.5 kg)  Height: 5\' 6"  (1.676 m)   Physical Exam Constitutional:      General: He is not in acute distress.    Appearance: Normal appearance. He is not ill-appearing, toxic-appearing or diaphoretic.  HENT:     Head: Normocephalic and atraumatic.     Jaw: Tenderness (R sided) present.     Comments: R temple tenderness to palpation     Right Ear: Hearing and tympanic membrane normal. No drainage or swelling. No mastoid tenderness. Tympanic membrane is not injected or erythematous.     Ears:     Comments: External ear demonstrated multiple scabbed lesions    Mouth/Throat:     Mouth: Mucous membranes are moist.     Pharynx: Oropharynx is clear. No oropharyngeal exudate or posterior oropharyngeal erythema.  Eyes:     General: No visual field deficit or scleral icterus.       Right eye: No discharge.        Left eye: No discharge.     Extraocular Movements: Extraocular movements intact.     Right eye: Normal extraocular motion and no nystagmus.     Left eye: Normal extraocular motion and no nystagmus.     Conjunctiva/sclera: Conjunctivae normal.     Pupils: Pupils are equal, round, and reactive to light.     Comments: Vision 20/70 bilaterally via Snellen chart, pt reports subjective blurriness in his right eye compared to left  Cardiovascular:     Rate and Rhythm: Normal rate and  regular rhythm.     Pulses: Normal pulses.     Heart sounds: Normal heart sounds. No murmur.  Pulmonary:     Effort: Pulmonary effort is normal. No respiratory distress.     Breath sounds: Normal breath sounds. No wheezing or rales.  Musculoskeletal:     Cervical back: Neck supple. No tenderness.  Lymphadenopathy:     Cervical: No cervical adenopathy.    Neurologic: Mental Status: Patient is awake, alert, oriented x3  No signs of aphasia or neglect Cranial Nerves: II: Pupils equal, round, and reactive to light.   III,IV, VI: EOMI without ptosis or diploplia.  V: Facial sensation is symmetric to light touch  VII: Facial movement is asymmetric. R sided facial droop VIII: hearing is intact to voice X: Uvula elevates symmetrically XI: Shoulder shrug is symmetric. XII: tongue is midline without atrophy or fasciculations.  Motor: 5/5 strength in all extremities  Sensory: Sensation is grossly intact  Cerebellar: normal gait  Assessment & Plan:   See Encounters Tab for problem based charting.  Patient seen with Dr. 10[birth year]24[birth year]1992

## 2020-04-01 NOTE — Patient Instructions (Addendum)
Thank you for visiting [place] in clinic today.  Below is a summary of what we discussed:  1.  Giant cell arteritis -You may have a condition that is causing inflammation in the artery in your head, causing the pain in your head, neck and shoulder and blurred vision. -We are getting blood tests to see if there are signs of inflammation -We are also prescribing you another 9-day course of prednisone 60 mg daily  2.  Diabetes -Continue taking your insulin as prescribed prediabetes  3.  Follow-up -Please follow-up in 1 week so we can monitor your vision, and pain symptoms and make sure that you are responding to the steroids.  ........  Gracias por visitarnos en la clnica hoy. A continuacin se muestra un resumen de lo que discutimos:  1. Arteritis de clulas gigantes -Puede tener una afeccin que le est causando inflamacin en la arteria de la cabeza, lo que le causa dolor en la cabeza, cuello y hombro y visin borrosa. -Nos hacen anlisis de sangre para ver si hay signos de inflamacin. -Tambin le estamos recetando otro curso de 9 das de prednisona 60 mg al da  2. Diabetes -Contine tomando su insulina como prediabetes recetada  3. Seguimiento -Haga un seguimiento en 1 semana para que podamos controlar su visin y los sntomas del dolor y asegurarnos de que est respondiendo a los esteroides.

## 2020-04-03 NOTE — Progress Notes (Signed)
Internal Medicine Clinic Attending  Case discussed with Dr. Lyn Hollingshead at the time of the visit.  We reviewed the resident's history and exam and pertinent patient test results.  I agree with the assessment, diagnosis, and plan of care documented in the resident's note.  Sed rate & CRP nl, combined with PTP of TA, bx is not indicated.

## 2020-04-08 ENCOUNTER — Ambulatory Visit: Payer: Self-pay | Admitting: Internal Medicine

## 2020-04-08 ENCOUNTER — Encounter: Payer: Self-pay | Admitting: Internal Medicine

## 2020-04-08 VITALS — BP 127/79 | HR 73 | Temp 98.0°F | Ht 66.0 in | Wt 197.9 lb

## 2020-04-08 DIAGNOSIS — G510 Bell's palsy: Secondary | ICD-10-CM

## 2020-04-08 DIAGNOSIS — E1169 Type 2 diabetes mellitus with other specified complication: Secondary | ICD-10-CM

## 2020-04-08 DIAGNOSIS — E1165 Type 2 diabetes mellitus with hyperglycemia: Secondary | ICD-10-CM

## 2020-04-08 DIAGNOSIS — H538 Other visual disturbances: Secondary | ICD-10-CM

## 2020-04-08 DIAGNOSIS — Z794 Long term (current) use of insulin: Secondary | ICD-10-CM

## 2020-04-08 MED ORDER — NOVOLIN 70/30 (70-30) 100 UNIT/ML ~~LOC~~ SUSP: 18.0000 [IU] | Freq: Two times a day (BID) | SUBCUTANEOUS | 3 refills | Status: DC

## 2020-04-08 MED ORDER — METFORMIN HCL ER 500 MG PO TB24: 500.0000 mg | ORAL_TABLET | Freq: Every day | ORAL | 11 refills | Status: DC

## 2020-04-08 NOTE — Progress Notes (Signed)
   CC: Diabetes follow up   HPI:  Mr.Allen Lowery is a 59 y.o. Spanish-speaking male with a history of uncontrolled diabetes who presents today for diabetes follow-up.  Please refer to the problem based charting for further details.  Patient is accompanied by his son-in-law.  Interpreter services were used.  Past Medical History:  Diagnosis Date  . Diabetes mellitus without complication (HCC)    Review of Systems: Systems were reviewed and are otherwise negative unless mentioned in the HPI.  Physical Exam:  Vitals:   04/08/20 1021  BP: 127/79  Pulse: 73  Temp: 98 F (36.7 C)  TempSrc: Oral  SpO2: 100%  Weight: 197 lb 14.4 oz (89.8 kg)  Height: 5\' 6"  (1.676 m)   Physical Exam Vitals reviewed.  Constitutional:      General: He is not in acute distress.    Appearance: Normal appearance. He is obese. He is not ill-appearing, toxic-appearing or diaphoretic.  HENT:     Head: Normocephalic.     Ears:     Comments: Well-healing scabbed lesions on the R external ear Cardiovascular:     Rate and Rhythm: Normal rate and regular rhythm.     Pulses: Normal pulses.     Heart sounds: Normal heart sounds. No murmur. No friction rub. No gallop.   Pulmonary:     Effort: Pulmonary effort is normal. No respiratory distress.     Breath sounds: Normal breath sounds. No wheezing or rales.  Abdominal:     General: Abdomen is flat. Bowel sounds are normal. There is no distension.     Palpations: Abdomen is soft.  Musculoskeletal:     Right lower leg: No edema.     Left lower leg: No edema.  Neurological:     General: No focal deficit present.     Mental Status: He is alert and oriented to person, place, and time.     Cranial Nerves: No cranial nerve deficit.     Motor: No weakness.     Gait: Gait normal.     Comments: R sided facial droop  Psychiatric:        Mood and Affect: Mood normal.    Assessment & Plan:   See Encounters Tab for problem based charting.  Patient  discussed with Dr. 

## 2020-04-08 NOTE — Patient Instructions (Addendum)
Thank you for visiting [place] in clinic today.  Below is a summary of what we discussed:  1.  Type 2 diabetes -Stop taking the prednisone -We are adjusting your insulin.  Please take the Novolin insulin 18 units twice a day with meals. -Start taking Metformin 500 mg daily.  We will plan to increase this medication over the next several weeks -Continue measuring your blood glucose levels.  It may be helpful to write them in the diary so we can go over them at your next visit.  2.  Follow-up -Please set up an appointment to see [place] again in 2-4 weeks to make sure you are tolerating the medicines okay.  If you have any questions or concerns, please feel free to reach out to [place].  ..........  Gracias por visitarnos en la clnica hoy. A continuacin se muestra un resumen de lo que discutimos:  1. Diabetes tipo 2 -Deje de tomar prednisona -Estamos ajustando tu insulina. Tome la insulina Novolin 18 [employer] veces al da con las comidas. -Comience a tomar metformina 500 mg al [employer]. Planearemos aumentar este medicamento durante las prximas semanas. -Contine midiendo sus niveles de glucosa en [place]. Puede ser til escribirlos en el diario para que podamos repasarlos en su prxima visita.  2. Seguimiento -Haga una cita para volver a vernos en 2-4 semanas para asegurarse de que est [employer].  Si tiene alguna pregunta o inquietud, no dude en comunicarse con nosotros.

## 2020-04-09 ENCOUNTER — Encounter: Payer: Self-pay | Admitting: Internal Medicine

## 2020-04-09 NOTE — Assessment & Plan Note (Signed)
At visit 1 week ago, patient endorsed blurred vision right-sided jaw and neck pain which raised concern for giant cell arteritis.  Stat CRP and ESR were obtained and were normal.  Patient received prednisone 60 mg daily for 7 days and did not notice any significant changes in his vision.  In fact, he endorsed today that he thinks his vision was blurred due to his eyes being teary.  The patient does not have temporal tenderness to palpation but continues to have some jaw pain and difficulty chewing I think this is more likely due to his Bell's palsy and should improve.  Plan: -Stop prednisone today

## 2020-04-09 NOTE — Assessment & Plan Note (Addendum)
Patient presents today as follow-up for uncontrolled diabetes.  When the patient was hospitalized on 5/15-5/16, he had an A1c of 12.9.  He has not taken any diabetes medications in the past.  Since that time he has been taking regular insulin 18 units twice daily.  The patient states that he has been taking his insulin as prescribed and does not miss any doses.  On average, his blood sugars have been in the low 200s.  Patient states that his wife prepares him diabetes modified meals, and is not interested in speaking with the dietitian in the future.  Since the patient is uninsured, I think he would benefit from [facility] consult.  I do think the patient would ultimately benefit from a GLP agonist or SGL2 inhibitor, but unfortunately the patient is uninsured and these medications are likely unaffordable.  Plan: -Discontinue regular insulin -Start Novolin 70/30 18 units twice daily -Start Metformin 500 mg daily with plan to uptitrate Metformin at next visit if patient tolerates well (F/u in 2-4 weeks) -Referral to chronic care management

## 2020-04-09 NOTE — Assessment & Plan Note (Signed)
The patient continues to have right-sided facial droop after being diagnosed with Bell's palsy on his hospitalization on 5/15-5/16.  The patient has not had much improvement in his facial weakness and difficulty chewing food in the past week, but notes that he does not think it has gotten any worse either.  We discussed how this condition tends to take several weeks to months to resolve, but he should make a recovery.  He was educated that he there are no medications that we can give him to hasten this recovery.  Patient is in agreement and voices understanding.  Plan: -Monitor at next visit

## 2020-04-11 NOTE — Progress Notes (Signed)
Internal Medicine Clinic Attending  Case discussed with Dr. Alexander at the time of the visit.  We reviewed the resident's history and exam and pertinent patient test results.  I agree with the assessment, diagnosis, and plan of care documented in the resident's note.  

## 2020-04-17 ENCOUNTER — Ambulatory Visit: Payer: Self-pay

## 2020-04-17 DIAGNOSIS — G510 Bell's palsy: Secondary | ICD-10-CM

## 2020-04-17 NOTE — Chronic Care Management (AMB) (Signed)
  Chronic Care Management   Outreach Note  04/17/2020 Name: Allen Lowery MRN: [id number] DOB: [birth year]  Referred by: Dellia Cloud, MD Reason for referral : Care Coordination (Disease Management, Medication Adherence/Assistance)   An unsuccessful telephone outreach was attempted today via [employer]. The patient was referred to the case management team for assistance with care management and care coordination.   Follow Up Plan: A HIPPA compliant phone message was left for the patient providing contact information and requesting a return call. RNCM, Cranford Mon, will plan to meet with patient after his financial counseling appointment scheduled for 04/23/20.       Malachy Chamber, BSW Embedded Care Coordination Social Worker [facility] [phone]

## 2020-04-23 ENCOUNTER — Telehealth: Payer: Self-pay

## 2020-04-23 ENCOUNTER — Telehealth: Payer: Self-pay | Admitting: *Deleted

## 2020-04-23 ENCOUNTER — Ambulatory Visit: Payer: Self-pay

## 2020-04-23 DIAGNOSIS — E1169 Type 2 diabetes mellitus with other specified complication: Secondary | ICD-10-CM

## 2020-04-23 MED ORDER — METFORMIN HCL ER 500 MG PO TB24: 500.0000 mg | ORAL_TABLET | Freq: Every day | ORAL | 11 refills | Status: DC

## 2020-04-23 MED ORDER — NOVOLIN 70/30 (70-30) 100 UNIT/ML ~~LOC~~ SUSP: 18.0000 [IU] | Freq: Two times a day (BID) | SUBCUTANEOUS | 3 refills | Status: DC

## 2020-04-23 NOTE — Telephone Encounter (Signed)
Patient walked in stating his meds were sent to [facility] but he needs them at [facility]. All other pharmacies removed from list. Patient has no coverage so will need under IM Program. L. [place], BSN, RN-BC

## 2020-04-23 NOTE — Telephone Encounter (Signed)
Novolin and metformin canceled at [facility] with Ezzie Dural, BSN, RN-BC

## 2020-05-01 ENCOUNTER — Encounter: Payer: Self-pay | Admitting: Internal Medicine

## 2020-05-01 ENCOUNTER — Ambulatory Visit: Payer: Self-pay

## 2020-05-01 DIAGNOSIS — Z0000 Encounter for general adult medical examination without abnormal findings: Secondary | ICD-10-CM | POA: Insufficient documentation

## 2020-05-01 NOTE — Assessment & Plan Note (Deleted)
I offered the patient to get vaccinated for Tdap, Pneumococcal pneumonia. Furthermore, I spoke to him about one time screening for HCV today.   Plan: 1. Administer Tdap and pneumococcal vaccines  2. HCV screening today

## 2020-05-01 NOTE — Assessment & Plan Note (Deleted)
Patient was recently started on Novolin 70/30, 18 units bid and metformin 500 mg. I can't see where he has had a recent eye or foot exam. Considering his new onset diagnosis of diabetes he will need to be screen for other risk factors for cardiovascular disease including high blood pressure and hyperlipidemia.   Plan: 1. Increase the does of Metformin to 500 mg BID. 2..Counsel patient on the importance of annual eye exams, 3 month follow up appointments for A1c, yearly screening for HLD, renal function, and proteinuria. I instructed him to keep blood glucose log daily. 3. Counseled patient on life style changes such as low carb diet and weight management.

## 2020-05-01 NOTE — Chronic Care Management (AMB) (Signed)
°  Chronic Care Management     05/01/2020 Name: Allen Lowery MRN: [id number] DOB: [birth year]  Referred by: Dellia Cloud, MD Reason for referral : Care Coordination (Disease management, medication aherence/assistance)  Planned to meet with patient today at scheduled appointment with Dr. Marchia Bond, however he did not show for this appointment.  Will make second attempt to connect with patient via phone for ccm program introduction/consent/enrollment.      Malachy Chamber, BSW Embedded Care Coordination Social Worker [facility] [phone]

## 2020-05-01 NOTE — Assessment & Plan Note (Deleted)
Patient presented to [facility] on 04/01/20 for a hospital follow up for Bell's palsy with concerns for giant cell arteritis due to pain over his right jaw with associated blurry vision. His visual acuity was noted to be  He received a prescription for prednisone 60 mg daily for 7 days. ESR and CRP were WNLs. At follow up appointment he was discontinued on the prednisone. I agree that this is more consistent with his Bell's Palsy symptoms likely secondary to viral infections. CT of head during his hospitalizations was negative for intracranial processes. I don't see where Lyme serologies were performed, but it does not appear that the patient had nay signs or symptoms to indicate lyme disease. It is likely that his symptoms could take weeks to months to resolve.   Plan:

## 2020-05-12 ENCOUNTER — Telehealth: Payer: Self-pay | Admitting: Internal Medicine

## 2020-05-12 NOTE — Chronic Care Management (AMB) (Signed)
  Care Management   Outreach Note  05/12/2020 Name: Allen Lowery MRN: [id number] DOB: [birth year]  Allen Lowery is a 59 y.o. year old male who is a primary care patient of Dellia Cloud, MD. I reached out to Allen Lowery by phone today in response to a referral sent by Mr. Darlina Rumpf PCP, Dellia Cloud, MD.     An unsuccessful telephone outreach was attempted today. The patient was referred to the case management team for assistance with care management and care coordination.   Follow Up Plan: The care management team will reach out to the patient again over the next 7 days.  If patient returns call to provider office, please advise to call Embedded Care Management Care Guide Gwenevere Ghazi at [phone].  Gwenevere Ghazi  Care Guide, Embedded Care Coordination [facility]  [place], [place] [address] Direct Dial: [phone] [email or web address] Website: [email or web address]

## 2020-05-23 ENCOUNTER — Telehealth: Payer: Self-pay | Admitting: *Deleted

## 2020-05-23 NOTE — Telephone Encounter (Signed)
  Care Management   Outreach Note  05/23/2020 Name: Allen Lowery MRN: [id number] DOB: 1961-05-17  Referred by: Dellia Cloud, MD Reason for referral : Appointment (Inital outreach for inital assessment)   An unsuccessful telephone outreach was attempted today. The patient was referred to the case management team for assistance with care management and care coordination. [employer] Interpreter ID # [id number] [id number] was used and message left in Spanish.   Follow Up Plan: A HIPPA compliant phone message was left for the patient providing contact information and requesting a return call.  The care management team will reach out to the patient again over the next 7-14 days.   Cranford Mon RN, CCM, CDCES CCM Clinic RN Care Manager [phone]

## 2020-05-26 ENCOUNTER — Ambulatory Visit: Payer: Self-pay

## 2020-05-26 NOTE — Chronic Care Management (AMB) (Signed)
  Chronic Care Management   Outreach Note  05/26/2020 Name: Allen Lowery MRN: [id number] DOB: Sep 07, 1961  Referred by: Dellia Cloud, MD Reason for referral : No chief complaint on file.   Multiple unsuccessful attempts made to connect with patient via phone or at [facility].  Follow Up Plan: The care management team is available to follow up with the patient after provider conversation with the patient regarding recommendation for care management engagement and subsequent re-referral to the care management team.     Malachy Chamber, BSW Embedded Care Coordination Social Worker [facility] [phone]

## 2020-05-27 NOTE — Chronic Care Management (AMB) (Signed)
  Care Management   Note  05/27/2020 Name: [name] MRN: [id number] DOB: 30-Mar-1961  Allen Lowery is a 59 y.o. year old male who is a primary care patient of Dellia Cloud, MD and is actively engaged with the care management team. I reached out to Allen Lowery by phone today to assist with re-scheduling an initial visit with the BSW.  Follow up plan: Unsuccessful telephone outreach attempt made. A HIPPA compliant phone message was left for the patient providing contact information and requesting a return call.  The care management team will reach out to the patient again over the next 7 days.  If patient returns call to provider office, please advise to call Embedded Care Management Care Guide Gwenevere Ghazi at [phone]  [facility] Guide, Embedded Care Coordination [facility]  [place], [place] [address] Direct Dial: [phone] [email or web address] Website: [email or web address]

## 2020-05-30 NOTE — Chronic Care Management (AMB) (Signed)
  Care Management   Note  05/30/2020 Name: Allen Lowery MRN: [id number] DOB: [birth year]  Allen Lowery is a 59 y.o. year old male who is a primary care patient of Dellia Cloud, MD and is actively engaged with the care management team. I reached out to Allen Lowery by phone today to assist with re-scheduling an initial visit with the BSW.  Follow up plan: Unsuccessful telephone outreach attempt made. Unable to make contact on outreach attempts x 3. PCP Dellia Cloud, MD and Amber Chrismon BSW notified via routed documentation in medical record.  The care management team is available to follow up with the patient after provider conversation with the patient regarding recommendation for care management engagement and subsequent re-referral to the care management team.   [facility] Guide, Embedded Care Coordination [facility]  [place], [place] [address] Direct Dial: [phone] [email or web address]: [email or web address]

## 2020-07-11 ENCOUNTER — Ambulatory Visit: Payer: Self-pay | Attending: Family Medicine | Admitting: Family Medicine

## 2020-07-11 ENCOUNTER — Other Ambulatory Visit: Payer: Self-pay

## 2020-07-11 ENCOUNTER — Encounter: Payer: Self-pay | Admitting: Family Medicine

## 2020-07-11 VITALS — BP 142/85 | HR 73 | Ht 66.0 in | Wt 195.0 lb

## 2020-07-11 DIAGNOSIS — Z758 Other problems related to medical facilities and other health care: Secondary | ICD-10-CM

## 2020-07-11 DIAGNOSIS — G510 Bell's palsy: Secondary | ICD-10-CM

## 2020-07-11 DIAGNOSIS — Z789 Other specified health status: Secondary | ICD-10-CM

## 2020-07-11 DIAGNOSIS — Z603 Acculturation difficulty: Secondary | ICD-10-CM

## 2020-07-11 DIAGNOSIS — L409 Psoriasis, unspecified: Secondary | ICD-10-CM

## 2020-07-11 DIAGNOSIS — N50819 Testicular pain, unspecified: Secondary | ICD-10-CM

## 2020-07-11 DIAGNOSIS — Z1211 Encounter for screening for malignant neoplasm of colon: Secondary | ICD-10-CM

## 2020-07-11 DIAGNOSIS — E1169 Type 2 diabetes mellitus with other specified complication: Secondary | ICD-10-CM

## 2020-07-11 DIAGNOSIS — N509 Disorder of male genital organs, unspecified: Secondary | ICD-10-CM

## 2020-07-11 LAB — POCT URINALYSIS DIP (CLINITEK)
Bilirubin, UA: NEGATIVE
Blood, UA: NEGATIVE
Glucose, UA: NEGATIVE mg/dL
Ketones, POC UA: NEGATIVE mg/dL
Leukocytes, UA: NEGATIVE
Nitrite, UA: NEGATIVE
POC PROTEIN,UA: NEGATIVE
Spec Grav, UA: 1.025
Urobilinogen, UA: 0.2 U/dL
pH, UA: 6

## 2020-07-11 LAB — POCT GLYCOSYLATED HEMOGLOBIN (HGB A1C): HbA1c, POC (controlled diabetic range): 6.4 % (ref 0.0–7.0)

## 2020-07-11 LAB — GLUCOSE, POCT (MANUAL RESULT ENTRY): POC Glucose: 135 mg/dL — AB (ref 70–99)

## 2020-07-11 MED ORDER — DOXYCYCLINE HYCLATE 100 MG PO TABS: 100.0000 mg | ORAL_TABLET | Freq: Two times a day (BID) | ORAL | 0 refills | Status: AC

## 2020-07-11 MED ORDER — METFORMIN HCL ER 500 MG PO TB24: 500.0000 mg | ORAL_TABLET | Freq: Every day | ORAL | 3 refills | Status: DC

## 2020-07-11 MED FILL — METFORMIN HCL ER 500 MG TB2: 500 | 90 days supply | Qty: 90 | Fill #0

## 2020-07-11 MED FILL — DOXYCYCLINE HYCLATE 100 MG: 100 | 7 days supply | Qty: 14 | Fill #0

## 2020-07-11 NOTE — Patient Instructions (Signed)
Psoriasis Psoriasis La psoriasis es una afeccin prolongada (crnica) en la piel. Se produce debido a que el sistema de defensa del cuerpo (el sistema inmunitario) hace que se formen clulas cutneas con mucha rapidez. Esto causa manchas (placas) rojas con relieve sobre la piel, que se ven plateadas. Las BJ's Wholesalemanchas pueden aparecer en todas las zonas del cuerpo. Pueden tener cualquier tamao o forma. La psoriasis puede ir y venir. Puede ser de leve a muy intensa. No puede transmitirse de [place] persona a otra (no es contagiosa). Esta afeccin no tiene [place], pero puede aliviarse con un tratamiento. Cules son las causas? La causa de la psoriasis no se conoce. Algunas cosas pueden hacerla empeorar. Estas son:  Daos en la piel, como cortes, raspones, quemaduras de sol y sequedad.  No recibir suficiente IT consultantluz solar.  Algunos medicamentos.  El alcohol.  Demetrius Revelabaco.  Estrs.  Infecciones. Qu incrementa el riesgo?  Tener un familiar con psoriasis.  Tener mucho sobrepeso (obesidad).  Irven Easterlyener entre [phone].  Tomar ciertos medicamentos. Cules son los signos o los sntomas? Existen diferentes tipos de psoriasis. Los tipos son los siguientes:  En placas. Este es el ms frecuente. Los sntomas incluyen manchas rojas y elevadas con un recubrimiento plateado. Pueden picar. Las uas pueden estar quebradizas o caerse.  En gotas. Los sntomas incluyen pequeas manchas rojas en la zona del Rising Sunestmago, los brazos y las piernas. Estas pueden presentarse despus de haber estado enfermo, por ejemplo, con faringitis estreptoccica.  Seborreica. Los sntomas incluyen Beazer Homesmanchas en las axilas, debajo de las Hanovermamas, en las partes privadas o en las nalgas.  Pustulosa. Los sntomas incluyen la presencia de bultos llenos de pus en las palmas de las manos o las plantas de los pies. Usted tambin puede sentirse muy cansado, dbil, tener fiebre y no tener apetito.  Eritrodrmica. Los sntomas incluyen piel de color  rojo brillante que parece quemada. Puede sentir los latidos cardacos acelerados y Press photographertener la temperatura corporal muy alta o muy baja. Quizs sienta picazn o dolor.  Sebopsoriasis. Los sntomas incluyen manchas rojas en el cuero cabelludo, la frente y el rostro que son Nigel Bridgemangrasosas.  Artritis psorisica. Los sntomas incluyen hinchazn y Engineer, miningdolor en las articulaciones junto con manchas escamosas en la piel. Cmo se trata? Esta afeccin no tiene [place], pero el tratamiento puede:  Ayudar a que la piel se cure.  Disminuir la picazn, la irritacin y la hinchazn (inflamacin).  Enlentecer el desarrollo de nuevas clulas cutneas.  Ayudar al sistema de defensa del cuerpo a responder mejor a la piel. El tratamiento puede incluir:  Cremas o ungentos.  Fototerapia. Esto puede incluir la luz natural del sol o la fototerapia en el consultorio mdico.  Medicamentos. Estos pueden ayudar al cuerpo a controlar mejor las clulas de la piel. Pueden utilizarse con fototerapia o ungentos. Los medicamentos pueden incluir pldoras o inyecciones. Si tiene una infeccin, tambin puede recibir antibiticos. Siga estas instrucciones en su casa: Cuidado de la piel  Aplquese una locin en la piel segn sea necesario. Utilice solo las Avnetcremas que le indic el mdico.  Aplique paos fros y hmedos (compresas fras) en las zonas afectadas.  No utilice el jacuzzi ni tome duchas de agua caliente. Use agua tibia, no caliente, al tomar duchas y baos de inmersin.  No se rasque la piel. Estilo de vida   No consuma ningn producto que contenga nicotina o tabaco, como cigarrillos, cigarrillos electrnicos y tabaco de Theatre managermascar. Si necesita ayuda para dejar de consumir, consulte al mdico.  Reduzca el estrs.  Mantenga un peso saludable.  Expngase al sol como se lo haya indicado el mdico. No se broncee.  Unirse a un grupo de apoyo. Medicamentos  Tome o use los medicamentos de venta libre y los recetados  solamente como se lo haya indicado el mdico.  Si le recetaron un antibitico, tmelo como se lo haya indicado el mdico. No deje de usar el antibitico aunque comience a Actor. Consumo de alcohol Si bebe alcohol:  Limite la cantidad que consume: ? De 0 a 1 medida por da para las mujeres. ? De 0 a 2 medidas por da para los hombres.  Est atento a la cantidad de alcohol que hay en las bebidas que toma. En los [place], una medida equivale a una botella de cerveza de 12oz ( ), un vaso de vino de 5oz ( ) o un vaso de una bebida alcohlica de alta graduacin de 1oz (38ml). Instrucciones generales  Lleve un diario para registrar los factores que le causan sntomas (desencadenantes). Trate de evitar esos factores.  Vaya a un consejero si siente que el [employer] sera til.  Concurra a todas las visitas de [address] se lo haya indicado el mdico. Esto es importante. Comunquese con un mdico si:  Tiene fiebre.  El dolor [place].  Aumentan el enrojecimiento y Company secretary en la zona de la herida.  Comienza a Surveyor, mining o rigidez en las articulaciones, o estos sntomas empeoran.  Las uas comienzan a romperse fcilmente o a desprenderse del lecho ungueal.  Se siente muy triste (deprimido). Resumen  La psoriasis es una afeccin prolongada (crnica) en la piel.  Esta afeccin no tiene [place], pero el tratamiento puede ayudar a Theatre manager.  Lleve un diario para Editor, commissioning causan sntomas.  Tome o use los medicamentos de venta libre y los recetados solamente como se lo haya indicado el mdico.  Oceanographer a todas las visitas de seguimiento como se lo haya indicado el mdico. Esto es importante. Esta informacin no tiene Theme park manager el consejo del mdico. Asegrese de hacerle al mdico cualquier pregunta que tenga. Document Revised: 10/04/2018 Document Reviewed: 10/04/2018 Elsevier Patient Education  2020 Elsevier  Inc.  Diabetes Basics  Diabetes (diabetes mellitus) is a long-term (chronic) disease. It occurs when the body does not properly use sugar (glucose) that is released from food after you eat. Diabetes may be caused by one or both of these problems:  Your pancreas does not make enough of a hormone called insulin.  Your body does not react in a normal way to insulin that it makes. Insulin lets sugars (glucose) go into cells in your body. This gives you energy. If you have diabetes, sugars cannot get into cells. This causes high blood sugar (hyperglycemia). Follow these instructions at home: How is diabetes treated? You may need to take insulin or other diabetes medicines daily to keep your blood sugar in balance. Take your diabetes medicines every day as told by your doctor. List your diabetes medicines here: Diabetes medicines  Name of medicine: ______________________________ ? Amount (dose): _______________ Time (a.m./p.m.): _______________ Notes: ___________________________________  Name of medicine: ______________________________ ? Amount (dose): _______________ Time (a.m./p.m.): _______________ Notes: ___________________________________  Name of medicine: ______________________________ ? Amount (dose): _______________ Time (a.m./p.m.): _______________ Notes: ___________________________________ If you use insulin, you will learn how to give yourself insulin by injection. You may need to adjust the amount based on the food that you eat. List the types of insulin you use here: Insulin  Insulin type: ______________________________ ?  Amount (dose): _______________ Time (a.m./p.m.): _______________ Notes: ___________________________________  Insulin type: ______________________________ ? Amount (dose): _______________ Time (a.m./p.m.): _______________ Notes: ___________________________________  Insulin type: ______________________________ ? Amount (dose): _______________ Time  (a.m./p.m.): _______________ Notes: ___________________________________  Insulin type: ______________________________ ? Amount (dose): _______________ Time (a.m./p.m.): _______________ Notes: ___________________________________  Insulin type: ______________________________ ? Amount (dose): _______________ Time (a.m./p.m.): _______________ Notes: ___________________________________ How do I manage my blood sugar?  Check your blood sugar levels using a blood glucose monitor as directed by your doctor. Your doctor will set treatment goals for you. Generally, you should have these blood sugar levels:  Before meals (preprandial): 80-130 mg/dL (3.9-0.3 mmol/L).  After meals (postprandial): below 180 mg/dL (10 mmol/L).  A1c level: less than 7%. Write down the times that you will check your blood sugar levels: Blood sugar checks  Time: _______________ Notes: ___________________________________  Time: _______________ Notes: ___________________________________  Time: _______________ Notes: ___________________________________  Time: _______________ Notes: ___________________________________  Time: _______________ Notes: ___________________________________  Time: _______________ Notes: ___________________________________  What do I need to know about low blood sugar? Low blood sugar is called hypoglycemia. This is when blood sugar is at or below 70 mg/dL (3.9 mmol/L). Symptoms may include:  Feeling: ? Hungry. ? Worried or nervous (anxious). ? Sweaty and clammy. ? Confused. ? Dizzy. ? Sleepy. ? Sick to your stomach (nauseous).  Having: ? A fast heartbeat. ? A headache. ? A change in your vision. ? Tingling or no feeling (numbness) around the mouth, lips, or tongue. ? Jerky movements that you cannot control (seizure).  Having trouble with: ? Moving (coordination). ? Sleeping. ? Passing out (fainting). ? Getting upset easily (irritability). Treating low blood sugar To treat  low blood sugar, eat or drink something sugary right away. If you can think clearly and swallow safely, follow the 15:15 rule:  Take 15 grams of a fast-acting carb (carbohydrate). Talk with your doctor about how much you should take.  Some fast-acting carbs are: ? Sugar tablets (glucose pills). Take 3-4 glucose pills. ? 6-8 pieces of hard candy. ? 4-6 oz (120-150 mL) of fruit juice. ? 4-6 oz (120-150 mL) of regular (not diet) soda. ? 1 Tbsp (15 mL) honey or sugar.  Check your blood sugar 15 minutes after you take the carb.  If your blood sugar is still at or below 70 mg/dL (3.9 mmol/L), take 15 grams of a carb again.  If your blood sugar does not go above 70 mg/dL (3.9 mmol/L) after 3 tries, get help right away.  After your blood sugar goes back to normal, eat a meal or a snack within 1 hour. Treating very low blood sugar If your blood sugar is at or below 54 mg/dL (3 mmol/L), you have very low blood sugar (severe hypoglycemia). This is an emergency. Do not wait to see if the symptoms will go away. Get medical help right away. Call your local emergency services (911 in the [place] Do not drive yourself to the hospital. Questions to ask your health care provider  Do I need to meet with a diabetes educator?  What equipment will I need to care for myself at home?  What diabetes medicines do I need? When should I take them?  How often do I need to check my blood sugar?  What number can I call if I have questions?  When is my next doctor's visit?  Where can I find a support group for people with diabetes? Where to find more information  American Diabetes Association: www.diabetes.org  American Association of  Diabetes Educators: www.diabeteseducator.org/patient-resources Contact a doctor if:  Your blood sugar is at or above 240 mg/dL (16.1 mmol/L) for 2 days in a row.  You have been sick or have had a fever for 2 days or more, and you are not getting better.  You have any of  these problems for more than 6 hours: ? You cannot eat or drink. ? You feel sick to your stomach (nauseous). ? You throw up (vomit). ? You have watery poop (diarrhea). Get help right away if:  Your blood sugar is lower than 54 mg/dL (3 mmol/L).  You get confused.  You have trouble: ? Thinking clearly. ? Breathing. Summary  Diabetes (diabetes mellitus) is a long-term (chronic) disease. It occurs when the body does not properly use sugar (glucose) that is released from food after digestion.  Take insulin and diabetes medicines as told.  Check your blood sugar every day, as often as told.  Keep all follow-up visits as told by your doctor. This is important. This information is not intended to replace advice given to you by your health care provider. Make sure you discuss any questions you have with your health care provider. Document Revised: 07/11/2019 Document Reviewed: 01/20/2018 Elsevier Patient Education  2020 Elsevier Inc.  Informacin bsica sobre la diabetes Diabetes Basics  La diabetes (diabetes mellitus) es una enfermedad de larga duracin (crnica). Se produce cuando el cuerpo no utiliza Artist (glucosa) que se libera de los alimentos despus de comer. La diabetes puede deberse a uno de [employer] o a ambos:  El pncreas no produce suficiente cantidad de una hormona llamada insulina.  El cuerpo no reacciona de forma normal a la insulina que produce. La insulina permite que ciertos azcares (glucosa) ingresen a las clulas del cuerpo. Esto le proporciona energa. Si tiene diabetes, los azcares no pueden ingresar a las clulas. Esto produce un aumento del nivel de Banker (hiperglucemia). Sigue estas instrucciones en tu casa: Cmo se trata la diabetes? Es posible que tenga que administrarse insulina u otros medicamentos para la diabetes todos los [place] para mantener el nivel de International aid/development worker en la sangre equilibrado. Adminstrese los  medicamentos para la diabetes todos los [place] se lo haya indicado el mdico. Haga una lista de los medicamentos para la diabetes aqu: Medicamentos para la diabetes  Nombre del medicamento: ______________________________ ? Cantidad (dosis): ________________ Mammie Russian (a.m./p.m.): _______________ Cleon Dew: ___________________________________  Josephine Igo medicamento: ______________________________ ? Cantidad (dosis): ________________ Mammie Russian (a.m./p.m.): _______________ Cleon Dew: ___________________________________  Josephine Igo medicamento: ______________________________ ? Cantidad (dosis): ________________ Mammie Russian (a.m./p.m.): _______________ Cleon Dew: ___________________________________ Si [place] insulina, aprender cmo aplicrsela con inyecciones. Es posible que deba ajustar la cantidad en funcin de los alimentos que coma. Haga una lista de los tipos de [place] aqu: Insulina  Tipo de insulina: ______________________________ ? Cantidad (dosis): ________________ Mammie Russian (a.m./p.m.): _______________ Cleon Dew: ___________________________________  Levander Campion: ______________________________ ? Cantidad (dosis): ________________ Mammie Russian (a.m./p.m.): _______________ Cleon Dew: ___________________________________  Levander Campion: ______________________________ ? Cantidad (dosis): ________________ Mammie Russian (a.m./p.m.): _______________ Cleon Dew: ___________________________________  Levander Campion: ______________________________ ? Cantidad (dosis): ________________ Mammie Russian (a.m./p.m.): _______________ Cleon Dew: ___________________________________  Levander Campion: ______________________________ ? Cantidad (dosis): ________________ Mammie Russian (a.m./p.m.): _______________ Cleon Dew: ___________________________________ Cmo me controlo el nivel de azcar en la sangre?  Controle sus niveles de azcar en la sangre con un medidor de glucemia segn las indicaciones del mdico. El mdico fijar los objetivos del  tratamiento para usted. Generalmente, los [employer] de los niveles de International aid/development worker en la sangre deben ser los siguientes:  Antes de las comidas (preprandial): de 80 a 130mg /dl (de 4,4 a ).  Despus de las comidas (posprandial): por debajo de 180mg /dl (9,3GHWE/X).  Nivel de A1c: menos del 7%. Anote las veces que se controlar los niveles de azcar en la sangre: Controles de azcar en la sangre  Hora: _______________ : ___________________________________  [id number]: _______________ Notas: ___________________________________  Hora: _______________ Notas: ___________________________________  Hora: _______________ Notas: ___________________________________  Hora: _______________ Notas: ___________________________________  Hora: _______________ Notas: ___________________________________  Cleon Dew debo saber acerca del nivel bajo de azcar en la sangre? Un nivel bajo de azcar en la sangre se denomina hipoglucemia. Este cuadro ocurre cuando el nivel de Mammie Russian en la sangre es igual o menor que 70mg /dl (Ladell Heads). Entre los sntomas, se pueden incluir los siguientes:  Sentir: ? [place]. ? Preocupacin o nervios (ansiedad). ? Sudoracin y . ? Confusin. ? Mareos. ? Somnolencia. ? Ganas de vomitar (nuseas).  Tener: ? Latidos cardacos acelerados. ? Dolor de 7,8LFYB/O. ? Cambios en la visin. ? Hormigueo y falta de sensibilidad (entumecimiento) alrededor de la boca, los labios o la [place]. ? Movimientos espasmdicos que no puede controlar (convulsiones).  Dificultades para hacer lo siguiente: ? Moverse (coordinacin). ? Dormir. ? Desmayos. ? Molestarse con facilidad (irritabilidad). Tratamiento del nivel bajo de azcar en la sangre Para tratar un nivel bajo de azcar en la sangre, ingiera un alimento o una bebida azucarada de inmediato. Si puede pensar con claridad y tragar de manera segura, siga la regla 15/15, que consiste en lo siguiente:  Consuma 15gramos de  un hidrato de carbono de accin rpida (carbohidrato). Hable con su mdico acerca de cunto debera consumir.  Algunos hidratos de carbono de accin rpida son: ? Comprimidos de azcar (pastillas de glucosa). Consuma 3o 4pastillas de glucosa. ? De 6 a 8unidades de caramelos duros. ? De 4 a 6onzas (de 120 a [employer]) de jugo de frutas. ? De 4 a 6onzas (de 120 a [place]) de refresco comn (no diettico). ? 1 cucharada (78ml) de miel o azcar.  Contrlese el nivel de azcar en la sangre despus de ingerir el hidrato de carbono.  Si el nivel de azcar en la sangre todava es igual o menor que 70mg /dl ( ), ingiera nuevamente 15gramos de un hidrato de carbono.  Si el nivel de azcar en la sangre no supera los 70mg /dl (12m) despus de 3intentos, solicite ayuda de inmediato.  Ingiera una comida o una colacin en el transcurso de 1hora despus de que el nivel de azcar en la sangre se haya normalizado. Tratamiento del nivel muy bajo de azcar en la sangre Si el nivel de azcar en la sangre es igual o menor que 54mg /dl (89mmol/l), significa que est muy bajo (hipoglucemia grave). Esto es . No espere a ver si los sntomas desaparecen. Solicite atencin mdica de inmediato. Comunquese con el servicio de emergencias de su localidad (911 en los [place]). No conduzca por sus propios medios 1,7PZWC/H hospital. Preguntas para hacerle al mdico  Es necesario que me rena con en el cuidado de la diabetes?  Qu equipos necesitar para cuidarme en casa?  Qu medicamentos para la diabetes necesito? Cundo debo tomarlos?  Con qu frecuencia debo controlar mi nivel de azcar en la sangre?  A qu nmero puedo llamar si tengo preguntas?  Cundo es mi prxima cita con el mdico?  Dnde puedo encontrar un grupo de apoyo para las personas con diabetes? Dnde buscar ms informacin  American Diabetes Association (Asociacin  Estadounidense de la  Diabetes): www.diabetes.org  American Association of Diabetes Educators (Asociacin Estadounidense de Instructores para el Cuidado de la Diabetes): www.diabeteseducator.org/patient-resources Comunquese con un mdico si:  El nivel de azcar en la sangre es igual o mayor que /dl (45,4UJWJ/XB) durante 2das seguidos.  Ha estado enfermo o ha tenido fiebre durante 2das o ms y no mejora.  Tiene alguno de estos problemas durante ms de 6horas: ? No puede comer ni beber. ? Siente malestar estomacal (nuseas). ? Vomita. ? Presenta heces lquidas (diarrea). Solicite ayuda inmediatamente si:  El nivel de azcar en la sangre est por debajo de /dl (68mmol/l).  Se siente confundida.  Tiene dificultad para hacer lo siguiente: ? Pensar con claridad. ? La respiracin. Resumen  La diabetes (diabetes mellitus) es una enfermedad de larga duracin (crnica). Se produce cuando el cuerpo no utiliza Artist (glucosa) que se libera de los alimentos despus de la digestin.  Aplquese la insulina y tome los medicamentos para la diabetes como se lo hayan indicado.  Contrlese el nivel de azcar en la sangre todos los [place], con la frecuencia que le hayan indicado.  Concurra a todas las visitas de [address] se lo haya indicado el mdico. Esto es importante. Esta informacin no tiene Theme park manager el consejo del mdico. Asegrese de hacerle al mdico cualquier pregunta que tenga. Document Revised: 12/13/2018 Document Reviewed: 02/24/2018 Elsevier Patient Education  2020 [employer].

## 2020-07-11 NOTE — Progress Notes (Signed)
Having pain in testicles for 3 months.  Has been out of medication.

## 2020-07-11 NOTE — Progress Notes (Signed)
New Patient Office Visit  Subjective:  Patient ID: Allen Lowery, male    DOB: [birth year]  Age: 59 y.o. MRN: [id number]   Due to language barrier, patient is accompanied by an interpreter at today's visit.  CC:  Chief Complaint  Patient presents with  . Establish Care    HPI Allen Lowery, 59 yo male who was seen . He has Type 2 DM and last Hgb A1c on review of chart was elevated at 12.9 on 03/15/2020.  He reports that he has been taking his prescribed medications for his diabetes and that his blood sugars have remained in the 140s or less.  He denies any current issues with increased thirst, no blurred vision and no urinary frequency.  He has noticed that he has had issues for about 6 months with abnormal sensation of pain in his testicles, as if his testicles are being squeezed.  He denies any burning with urination and he has noticed no penile discharge.  He does not have any concerns regarding sexually transmitted diseases.      He is status post hospital admission 03/14/2020 through 03/16/2020 at [facility] when he was diagnosed with type 2 diabetes as well as Bell's palsy.  He continues to have some right-sided facial weakness but he reports that this is improving.  He does continue to have issues with being able to fully close his right eye.  He is using moisturizing eyedrops and denies any eye pain or changes in vision.  He does need to schedule an eye appointment and follow-up of his diabetes.  Past Medical History:  Diagnosis Date  . Diabetes mellitus without complication [facility])     Past Surgical History:  Procedure Laterality Date  . kidney stones      Family History  Problem Relation Age of Onset  . Diabetes Mother   . Heart disease Neg Hx   . Hypertension Neg Hx   . Cancer Neg Hx     Social History   Socioeconomic History  . Marital status: Married    Spouse name: Not on file  . Number of children: Not on file  . Years of education: Not on file   . Highest education level: Not on file  Occupational History  . Not on file  Tobacco Use  . Smoking status: Never Smoker  . Smokeless tobacco: Never Used  Substance and Sexual Activity  . Alcohol use: No  . Drug use: No  . Sexual activity: Not on file  Other Topics Concern  . Not on file  Social History Narrative  . Not on file   Social Determinants of Health   Financial Resource Strain:   . Difficulty of Paying Living Expenses: Not on file  Food Insecurity:   . Worried About Programme researcher, broadcasting/film/video in the Last Year: Not on file  . Ran Out of Food in the Last Year: Not on file  Transportation Needs:   . Lack of Transportation (Medical): Not on file  . Lack of Transportation (Non-Medical): Not on file  Physical Activity:   . Days of Exercise per Week: Not on file  . Minutes of Exercise per Session: Not on file  Stress:   . Feeling of Stress : Not on file  Social Connections:   . Frequency of Communication with Friends and Family: Not on file  . Frequency of Social Gatherings with Friends and Family: Not on file  . Attends Religious Services: Not on file  . Active  Member of Clubs or Organizations: Not on file  . Attends Banker Meetings: Not on file  . Marital Status: Not on file  Intimate Partner Violence:   . Fear of Current or Ex-Partner: Not on file  . Emotionally Abused: Not on file  . Physically Abused: Not on file  . Sexually Abused: Not on file    ROS Review of Systems  Constitutional: Negative for chills, fatigue and fever.  HENT: Negative for sore throat and trouble swallowing.   Eyes: Negative for photophobia and visual disturbance.  Respiratory: Negative for cough and shortness of breath.   Cardiovascular: Negative for chest pain, palpitations and leg swelling.  Gastrointestinal: Negative for abdominal pain, blood in stool, constipation, diarrhea and nausea.  Endocrine: Negative for polydipsia, polyphagia and polyuria.  Genitourinary: Positive  for testicular pain. Negative for discharge, dysuria, flank pain, frequency, hematuria and urgency.  Musculoskeletal: Negative for arthralgias and back pain.  Skin: Negative for rash and wound.  Neurological: Positive for weakness (right facial weakness due to Bell's palsy). Negative for dizziness and headaches.  Hematological: Negative for adenopathy. Does not bruise/bleed easily.  Psychiatric/Behavioral: Negative for suicidal ideas. The patient is not nervous/anxious.     Objective:   Today's Vitals: BP (!) 142/85   Pulse 73   Ht 5\' 6"  (1.676 m)   Wt 195 lb (88.5 kg)   SpO2 98%   BMI 31.47 kg/m   Physical Exam Vitals and nursing note reviewed. Exam conducted with a chaperone present (Accompanied by a male Spanish interpreter).  Constitutional:      Appearance: Normal appearance.  Eyes:     Extraocular Movements: Extraocular movements intact.     Conjunctiva/sclera: Conjunctivae normal.     Comments: Right eye is slightly watery  Neck:     Vascular: No carotid bruit.  Cardiovascular:     Rate and Rhythm: Normal rate and regular rhythm.     Pulses: Normal pulses.  Pulmonary:     Effort: Pulmonary effort is normal.     Breath sounds: Normal breath sounds.  Abdominal:     Palpations: Abdomen is soft.     Tenderness: There is no abdominal tenderness. There is no right CVA tenderness, left CVA tenderness, guarding or rebound.  Genitourinary:    Comments: No reproducible tenderness on testicular exam. Patient did have small amount of white appearing discharge at the urethra. Patient is uncircumcised-denies any difficulty with retraction of the foreskin Musculoskeletal:     Cervical back: Normal range of motion and neck supple.     Right lower leg: No edema.     Left lower leg: No edema.  Lymphadenopathy:     Cervical: No cervical adenopathy.  Skin:    General: Skin is warm and dry.     Findings: Rash present.     Comments: Dry, hyperpigmented plaques of skin below the left  knee and on the dorsum of the inferior left great toe and skin of the foot adjacent to the base of the left great toe which appears consistent with psoriasis  Neurological:     Mental Status: He is alert and oriented to person, place, and time.     Cranial Nerves: Cranial nerve deficit (Patient with right-sided facial weakness and cannot fully close the right eyelid and is unable to keep the right and left closed against resistance) present.     Comments: Normal monofilament exam of the feet  Psychiatric:        Mood and Affect: Mood normal.  Behavior: Behavior normal.     Assessment & Plan:  1. Type 2 diabetes mellitus with other specified complication, without long-term current use of insulin (HCC) Last hemoglobin A1c on 03/15/2020 was elevated at 12.9.  He did have 05/21/2019 with another provider associated with this office in follow-up of his type 2 diabetes and to establish care but apparently was out of medication at the time of his May 2021 hospitalization for Bell's palsy.  He reports that he has been taking the prescribed medications for treatment of his diabetes.  His blood sugar at today's visit is 135 with hemoglobin A1c showing good control diabetes is 6.4.  He will continue his current medications along with monitoring of his blood sugars.  He will have CMP, urine microalbumin/creatinine ratio and lipid panel at today's visit.  Referral to ophthalmology for diabetic eye exam and Metformin refilled. - POCT glucose (manual entry) - POCT glycosylated hemoglobin (Hb A1C) - POCT URINALYSIS DIP (CLINITEK) - Lipid panel - Comprehensive metabolic panel - Ambulatory referral to Ophthalmology - metFORMIN (GLUCOPHAGE XR) 500 MG 24 hr tablet; Take 1 tablet (500 mg total) by mouth daily with breakfast.  Dispense: 90 tablet; Refill: 3 - Microalbumin/Creatinine Ratio, Urine  2. Bell's palsy He reports some improvement in his Bell's palsy symptoms but continues to have some right-sided  facial weakness including ability to fully close the right eyelids.  He is encouraged to continue the use of moisturizing eyedrops and he may also wish to try taping the right eyelid at night to help with moisture retention.  3. Persistent testicular pain He reports several months of recurrent testicular pain.  He will be referred to urology for further evaluation and treatment.  Prescription provided for doxycycline in case of urethritis.  Urinalysis and urine culture are also being done at today's visit and follow-up of his recurrent testicular pain. - Urine Culture - Ambulatory referral to Urology - doxycycline (VIBRA-TABS) 100 MG tablet; Take 1 tablet (100 mg total) by mouth 2 (two) times daily for 7 days.  Dispense: 14 tablet; Refill: 0  4. Screening for colon cancer Referral is being made for colonoscopy as a screening test for colon cancer.  5. Psoriasis Patient with hyperpigmented skin lesions beneath the left patella and one palpable left foot which appear consistent with psoriasis.  He is encouraged to keep these areas moisturized and if he develops itching to these areas or worsening of rash he is aware that he can contact the office to obtain a prescription steroid cream.  6. Language barrier Patient was accompanied by a Spanish-speaking  Interpreter at today's visit to help with language barrier  Outpatient Encounter Medications as of 07/11/2020  Medication Sig  . doxycycline (VIBRA-TABS) 100 MG tablet Take 1 tablet (100 mg total) by mouth 2 (two) times daily for 7 days.  . metFORMIN (GLUCOPHAGE XR) 500 MG 24 hr tablet Take 1 tablet (500 mg total) by mouth daily with breakfast.  . [DISCONTINUED] insulin NPH-regular Human (NOVOLIN 70/30) (70-30) 100 UNIT/ML injection Inject 18 Units into the skin 2 (two) times daily with a meal.  . [DISCONTINUED] metFORMIN (GLUCOPHAGE XR) 500 MG 24 hr tablet Take 1 tablet (500 mg total) by mouth daily with breakfast.   No facility-administered  encounter medications on file as of 07/11/2020.    Follow-up: Return in about 4 months (around 11/10/2020) for DM- sooner if needed.    Cain Saupe, MD

## 2020-07-12 LAB — MICROALBUMIN / CREATININE URINE RATIO
Creatinine, Urine: 83.1 mg/dL
Microalb/Creat Ratio: 24 mg/g{creat} (ref 0–29)
Microalbumin, Urine: 19.6 ug/mL

## 2020-07-12 LAB — COMPREHENSIVE METABOLIC PANEL WITH GFR
ALT: 25 IU/L (ref 0–44)
AST: 17 IU/L (ref 0–40)
Albumin/Globulin Ratio: 2 (ref 1.2–2.2)
Albumin: 4.7 g/dL (ref 3.8–4.9)
Alkaline Phosphatase: 63 IU/L (ref 48–121)
BUN/Creatinine Ratio: 20 (ref 9–20)
BUN: 18 mg/dL (ref 6–24)
Bilirubin Total: 0.4 mg/dL (ref 0.0–1.2)
CO2: 25 mmol/L (ref 20–29)
Calcium: 10.1 mg/dL (ref 8.7–10.2)
Chloride: 102 mmol/L (ref 96–106)
Creatinine, Ser: 0.88 mg/dL (ref 0.76–1.27)
GFR calc Af Amer: 109 mL/min/1.73
GFR calc non Af Amer: 95 mL/min/1.73
Globulin, Total: 2.3 g/dL (ref 1.5–4.5)
Glucose: 115 mg/dL — ABNORMAL HIGH (ref 65–99)
Potassium: 5.1 mmol/L (ref 3.5–5.2)
Sodium: 139 mmol/L (ref 134–144)
Total Protein: 7 g/dL (ref 6.0–8.5)

## 2020-07-12 LAB — LIPID PANEL
Chol/HDL Ratio: 4.8 ratio (ref 0.0–5.0)
Cholesterol, Total: 200 mg/dL — ABNORMAL HIGH (ref 100–199)
HDL: 42 mg/dL
LDL Chol Calc (NIH): 136 mg/dL — ABNORMAL HIGH (ref 0–99)
Triglycerides: 122 mg/dL (ref 0–149)
VLDL Cholesterol Cal: 22 mg/dL (ref 5–40)

## 2020-07-13 LAB — URINE CULTURE: Organism ID, Bacteria: NO GROWTH

## 2020-07-25 MED FILL — DOXYCYCLINE HYCLATE 100 MG: 100 | 7 days supply | Qty: 14 | Fill #0

## 2020-11-10 ENCOUNTER — Ambulatory Visit: Payer: Self-pay | Admitting: Family Medicine

## 2020-12-04 ENCOUNTER — Other Ambulatory Visit: Payer: Self-pay

## 2020-12-04 ENCOUNTER — Other Ambulatory Visit: Payer: Self-pay | Admitting: Internal Medicine

## 2020-12-04 ENCOUNTER — Ambulatory Visit: Payer: Self-pay | Attending: Internal Medicine | Admitting: Internal Medicine

## 2020-12-04 DIAGNOSIS — Z2821 Immunization not carried out because of patient refusal: Secondary | ICD-10-CM

## 2020-12-04 DIAGNOSIS — M5416 Radiculopathy, lumbar region: Secondary | ICD-10-CM

## 2020-12-04 DIAGNOSIS — E1169 Type 2 diabetes mellitus with other specified complication: Secondary | ICD-10-CM

## 2020-12-04 DIAGNOSIS — E785 Hyperlipidemia, unspecified: Secondary | ICD-10-CM

## 2020-12-04 MED ORDER — TRAMADOL HCL 50 MG PO TABS: 50.0000 mg | ORAL_TABLET | Freq: Two times a day (BID) | ORAL | 0 refills | Status: DC | PRN

## 2020-12-04 MED ORDER — ATORVASTATIN CALCIUM 10 MG PO TABS: 10.0000 mg | ORAL_TABLET | Freq: Every day | ORAL | 6 refills | Status: DC

## 2020-12-04 MED ORDER — IBUPROFEN 800 MG PO TABS: 800.0000 mg | ORAL_TABLET | Freq: Three times a day (TID) | ORAL | 1 refills | Status: DC | PRN

## 2020-12-04 MED ORDER — GABAPENTIN 300 MG PO CAPS: ORAL_CAPSULE | ORAL | 3 refills | Status: DC

## 2020-12-04 MED FILL — ATORVASTATIN 10 MG TABLET: 10 | 30 days supply | Qty: 30 | Fill #0

## 2020-12-04 MED FILL — GABAPENTIN 300 MG CAPSULE: 300 | 30 days supply | Qty: 60 | Fill #0

## 2020-12-04 MED FILL — traMADol HCL 50 MG TABS: 50 | 30 days supply | Qty: 60 | Fill #0

## 2020-12-04 MED FILL — IBUPROFEN 800 MG TABLET: 800 | 20 days supply | Qty: 60 | Fill #0

## 2020-12-04 NOTE — Progress Notes (Signed)
Pt states he has pain all the time 24/7 pt states he has a hernic disc

## 2020-12-04 NOTE — Progress Notes (Signed)
Virtual Visit via Telephone Note  I connected with Allen Lowery on 12/04/20 at  8:30 AM EST by telephone and verified that I am speaking with the correct person using two identifiers.  Location: Patient: home Provider: office  The patient, my CMA Ms. Julius Bowels, myself and Cristal Deer [phone]) [employer] participated in this encounter. I discussed the limitations, risks, security and privacy concerns of performing an evaluation and management service by telephone and the availability of in person appointments. I also discussed with the patient that there may be a patient responsible charge related to this service. The patient expressed understanding and agreed to proceed.   History of Present Illness: Patient with history of DM type II, Bell's palsy, HL.  PCP was Dr. Jillyn Hidden who is no longer with the practice.  Last seen 07/2020.  Purpose of today's visit is chronic disease management.  Pt reports he needs to apply for OC before next in-person appt.  DM:  Reports compliance with Metformin.  Last A1C was 6.4 Checks BS every morning.  Gives range 90-105.   Doing well with eating habits and staying active Over due for eye exam.  Uninsured LDL was 136 on labs done on last visit  C/o pain in lower back which he states is due to herniated disc. Reports being dx 3 yrs ago and was seeing a specialist (he does not recall the name) but OC expired. Last saw this specialist 2 yrs ago.  -pain in RT lower back from buttock to toes. Unable to walk for more than 5 minutes; leg feels weak at times. Rates pain 10/10.  Better with sitting.  Endorses tingling and numbness in RT leg No loss of bowel or bladder function Last worked 4-5 yrs ago. -taking Ibuprofen 1-3 pills a day. Helps a little  HM:  Due for COVID booster.  Due for Pneumovax and colon CA screening.  Declines Pneumovax and Tdap  Outpatient Encounter Medications as of 12/04/2020  Medication Sig  . metFORMIN (GLUCOPHAGE XR) 500  MG 24 hr tablet Take 1 tablet (500 mg total) by mouth daily with breakfast.   No facility-administered encounter medications on file as of 12/04/2020.      Observations/Objective: Results for orders placed or performed in visit on 07/11/20  Urine Culture   Specimen: Blood   UR  Result Value Ref Range   Urine Culture, Routine Final report    Organism ID, Bacteria No growth   Lipid panel  Result Value Ref Range   Cholesterol, Total 200 (H) 100 - 199 mg/dL   Triglycerides 094 0 - 149 mg/dL   HDL 42 >70 mg/dL   VLDL Cholesterol Cal 22 5 - 40 mg/dL   LDL Chol Calc (NIH) 962 (H) 0 - 99 mg/dL   Chol/HDL Ratio 4.8 0.0 - 5.0 ratio  Comprehensive metabolic panel  Result Value Ref Range   Glucose 115 (H) 65 - 99 mg/dL   BUN 18 6 - 24 mg/dL   Creatinine, Ser 8.36 0.76 - 1.27 mg/dL   GFR calc non Af Amer 95 >59 mL/min/1.73   GFR calc Af Amer 109 >59 mL/min/1.73   BUN/Creatinine Ratio 20 9 - 20   Sodium 139 134 - 144 mmol/L   Potassium 5.1 3.5 - 5.2 mmol/L   Chloride 102 96 - 106 mmol/L   CO2 25 20 - 29 mmol/L   Calcium 10.1 8.7 - 10.2 mg/dL   Total Protein 7.0 6.0 - 8.5 g/dL   Albumin 4.7 3.8 - 4.9 g/dL  Globulin, Total 2.3 1.5 - 4.5 g/dL   Albumin/Globulin Ratio 2.0 1.2 - 2.2   Bilirubin Total 0.4 0.0 - 1.2 mg/dL   Alkaline Phosphatase 63 48 - 121 IU/L   AST 17 0 - 40 IU/L   ALT 25 0 - 44 IU/L  Microalbumin/Creatinine Ratio, Urine  Result Value Ref Range   Creatinine, Urine 83.1 Not Estab. mg/dL   Microalbumin, Urine 19.3 Not Estab. ug/mL   Microalb/Creat Ratio 24 0 - 29 mg/g creat  POCT glucose (manual entry)  Result Value Ref Range   POC Glucose 135 (A) 70 - 99 mg/dl  POCT glycosylated hemoglobin (Hb A1C)  Result Value Ref Range   Hemoglobin A1C     HbA1c POC (<> result, manual entry)     HbA1c, POC (prediabetic range)     HbA1c, POC (controlled diabetic range) 6.4 0.0 - 7.0 %  POCT URINALYSIS DIP (CLINITEK)  Result Value Ref Range   Color, UA yellow yellow   Clarity,  UA clear clear   Glucose, UA negative negative mg/dL   Bilirubin, UA negative negative   Ketones, POC UA negative negative mg/dL   Spec Grav, UA 7.902 4.097 - 1.025   Blood, UA negative negative   pH, UA 6.0 5.0 - 8.0   POC PROTEIN,UA negative negative, trace   Urobilinogen, UA 0.2 0.2 or 1.0 E.U./dL   Nitrite, UA Negative Negative   Leukocytes, UA Negative Negative     Assessment and Plan: 1. Type 2 diabetes mellitus with other specified complication, without long-term current use of insulin (HCC) Blood sugars at goal.  Continue Metformin, healthy eating.  2. Hyperlipidemia associated with type 2 diabetes mellitus (HCC) - atorvastatin (LIPITOR) 10 MG tablet; Take 1 tablet (10 mg total) by mouth daily.  Dispense: 30 tablet; Refill: 6  3. 23-polyvalent pneumococcal polysaccharide vaccine declined   4. Tetanus, diphtheria, and acellular pertussis (Tdap) vaccination declined   5. Lumbar radiculopathy Sounds as though patient has a slipped disc.  Recommend that he applies for the orange card/cone discount and once approved he will let me know so that we can do imaging and then referred to a specialist.  In the meantime I will put him on Neurontin, limited supply of tramadol and ibuprofen to use as needed.  Warned that tramadol can cause drowsiness.  Advised to avoid heavy lifting, excessive pushing or pulling. - gabapentin (NEURONTIN) 300 MG capsule; 1 tab PO QHS x 1 wk then 1 tab BID  Dispense: 60 capsule; Refill: 3 - traMADol (ULTRAM) 50 MG tablet; Take 1 tablet (50 mg total) by mouth 2 (two) times daily as needed.  Dispense: 60 tablet; Refill: 0 - ibuprofen (ADVIL) 800 MG tablet; Take 1 tablet (800 mg total) by mouth every 8 (eight) hours as needed.  Dispense: 60 tablet; Refill: 1   Follow Up Instructions: 2 mths or sooner once he gets OC   I discussed the assessment and treatment plan with the patient. The patient was provided an opportunity to ask questions and all were  answered. The patient agreed with the plan and demonstrated an understanding of the instructions.   The patient was advised to call back or seek an in-person evaluation if the symptoms worsen or if the condition fails to improve as anticipated.  I provided 27 minutes of non-face-to-face time during this encounter.   Jonah Blue, MD

## 2020-12-10 ENCOUNTER — Ambulatory Visit: Payer: Self-pay | Attending: Internal Medicine

## 2020-12-10 ENCOUNTER — Other Ambulatory Visit: Payer: Self-pay

## 2020-12-31 ENCOUNTER — Ambulatory Visit: Payer: Self-pay | Admitting: Family Medicine

## 2021-02-02 ENCOUNTER — Ambulatory Visit: Payer: Self-pay | Admitting: Internal Medicine

## 2021-03-20 ENCOUNTER — Encounter: Payer: Self-pay | Admitting: Internal Medicine

## 2021-03-20 ENCOUNTER — Ambulatory Visit: Payer: Self-pay | Attending: Internal Medicine | Admitting: Internal Medicine

## 2021-03-20 ENCOUNTER — Other Ambulatory Visit: Payer: Self-pay

## 2021-03-20 VITALS — BP 132/82 | HR 80 | Resp 16 | Wt 212.2 lb

## 2021-03-20 DIAGNOSIS — E669 Obesity, unspecified: Secondary | ICD-10-CM

## 2021-03-20 DIAGNOSIS — E1169 Type 2 diabetes mellitus with other specified complication: Secondary | ICD-10-CM

## 2021-03-20 DIAGNOSIS — R030 Elevated blood-pressure reading, without diagnosis of hypertension: Secondary | ICD-10-CM

## 2021-03-20 DIAGNOSIS — M5416 Radiculopathy, lumbar region: Secondary | ICD-10-CM

## 2021-03-20 DIAGNOSIS — E785 Hyperlipidemia, unspecified: Secondary | ICD-10-CM

## 2021-03-20 DIAGNOSIS — Z1211 Encounter for screening for malignant neoplasm of colon: Secondary | ICD-10-CM

## 2021-03-20 LAB — POCT GLYCOSYLATED HEMOGLOBIN (HGB A1C): HbA1c, POC (controlled diabetic range): 7.3 % — AB (ref 0.0–7.0)

## 2021-03-20 LAB — GLUCOSE, POCT (MANUAL RESULT ENTRY): POC Glucose: 159 mg/dl — AB (ref 70–99)

## 2021-03-20 MED ORDER — ATORVASTATIN CALCIUM 10 MG PO TABS
ORAL_TABLET | Freq: Every day | ORAL | 6 refills | Status: DC
  Filled 2021-03-20: qty 30, 30d supply, fill #0

## 2021-03-20 MED ORDER — METFORMIN HCL ER 500 MG PO TB24
500.0000 mg | ORAL_TABLET | Freq: Two times a day (BID) | ORAL | 3 refills | Status: DC
  Filled 2021-03-20: qty 180, 90d supply, fill #0

## 2021-03-20 NOTE — Progress Notes (Signed)
Patient ID: Allen Lowery, male    DOB: August 21, 1961  MRN: [id number]  CC:  Chronic ds management  Subjective: Allen Lowery is a 60 y.o. male who presents for chronic ds management His concerns today include:  Patient with history of DM type II, Bell's palsy, HL, chronic radicular LBP.   Last seen 12/2020.  DM:  Results for orders placed or performed in visit on 03/20/21  POCT glucose (manual entry)  Result Value Ref Range   POC Glucose 159 (A) 70 - 99 mg/dl  POCT glycosylated hemoglobin (Hb A1C)  Result Value Ref Range   Hemoglobin A1C     HbA1c POC (<> result, manual entry)     HbA1c, POC (prediabetic range)     HbA1c, POC (controlled diabetic range) 7.3 (A) 0.0 - 7.0 %  has not checked BS in past 2 wks.  When he was checking range was 100-115 every morning Compliant with Metformin 500 mg daily Eating habits good.  However he has gained 17 lbs since 07/2020.   Not getting in much exercise due to chronic back pain No blurred vision  Lumbar ridiculopathy:  On last visit he c/o:  pain in lower back which he states is due to herniated disc. Reports being dx 3 yrs ago and was seeing a specialist (he does not recall the name) but OC expired. Last saw this specialist 2 yrs ago.  -pain in RT lower back from buttock to toes. Unable to walk for more than 5 minutes; leg feels weak at times. Rates pain 10/10.  Better with sitting.  Endorses tingling and numbness in RT leg No loss of bowel or bladder function Last worked 4-5 yrs ago. Today he states he can not walk or stand for more than 10 mins Pain still in RT lower back with radiation down RT leg. + numbness in RT leg Given Tramadol, Gabapentin and Ibuprofen on last visit, helped minimally for about 1-2 wks.    Elev BP noted today .  Elev on last in-person visit 07/11/2021.  He denies any chest pains or shortness of breath.  No lower extremity edema. Patient Active Problem List   Diagnosis Date Noted  . Lumbar  radiculopathy 12/04/2020  . Health care maintenance 05/01/2020  . T2DM (type 2 diabetes mellitus) (HCC) 04/01/2020  . Blurred vision, right eye 04/01/2020  . Ear abrasion 04/01/2020  . Bell's palsy 03/15/2020     Current Outpatient Medications on File Prior to Visit  Medication Sig Dispense Refill  . gabapentin (NEURONTIN) 300 MG capsule TAKE 1 CAPSULE BY MOUTH EVERY NIGHT AT BEDTIME FOR 1 WEEK THEN TAKE 1 CAPSULE BY MOUTH 2 TIMES DAILY 60 capsule 3  . ibuprofen (ADVIL) 800 MG tablet TAKE 1 TABLET (800 MG TOTAL) BY MOUTH EVERY 8 (EIGHT) HOURS AS NEEDED. 60 tablet 1  . methocarbamol (ROBAXIN) 500 MG tablet Take 500 mg by mouth 2 (two) times daily as needed for muscle spasms.     No current facility-administered medications on file prior to visit.    Allergies  Allergen Reactions  . Aspirin     Gi upset    Social History   Socioeconomic History  . Marital status: Single    Spouse name: Not on file  . Number of children: Not on file  . Years of education: Not on file  . Highest education level: Not on file  Occupational History  . Not on file  Tobacco Use  . Smoking status: Never Smoker  .  Smokeless tobacco: Never Used  Substance and Sexual Activity  . Alcohol use: No  . Drug use: No  . Sexual activity: Not on file  Other Topics Concern  . Not on file  Social History Narrative   ** Merged History Encounter **       Social Determinants of Health   Financial Resource Strain: Not on file  Food Insecurity: Not on file  Transportation Needs: Not on file  Physical Activity: Not on file  Stress: Not on file  Social Connections: Not on file  Intimate Partner Violence: Not on file    Family History  Problem Relation Age of Onset  . Diabetes Mother   . Heart disease Neg Hx   . Hypertension Neg Hx   . Cancer Neg Hx     Past Surgical History:  Procedure Laterality Date  . KIDNEY STONE SURGERY    . kidney stones      ROS: Review of Systems Negative except as  stated above  PHYSICAL EXAM: BP 132/82   Pulse 80   Resp 16   Wt 212 lb 3.2 oz (96.3 kg)   SpO2 97%   BMI 34.25 kg/m   Wt Readings from Last 3 Encounters:  03/20/21 212 lb 3.2 oz (96.3 kg)  07/11/20 195 lb (88.5 kg)  04/08/20 197 lb 14.4 oz (89.8 kg)   Physical Exam General appearance - alert, well appearing, and in no distress Mental status - normal mood, behavior, speech, dress, motor activity, and thought processes Neck - supple, no significant adenopathy Chest - clear to auscultation, no wheezes, rales or rhonchi, symmetric air entry Heart - normal rate, regular rhythm, normal S1, S2, no murmurs, rubs, clicks or gallops Extremities - peripheral pulses normal, no pedal edema, no clubbing or cyanosis  CMP Latest Ref Rng & Units 07/11/2020 03/14/2020 07/18/2015  Glucose 65 - 99 mg/dL 193(X) 902(I) 097(D)  BUN 6 - 24 mg/dL 18 20 12   Creatinine 0.76 - 1.27 mg/dL 5.32 9.92)  Sodium 134 - 144 mmol/L 139 133(L) 135  Potassium 3.5 - 5.2 mmol/L 5.1 4.4 4.1  Chloride 96 - 106 mmol/L 102 100 102  CO2 20 - 29 mmol/L 25 22 -  Calcium 8.7 - 10.2 mg/dL 4.26(S 9.3 -  Total Protein 6.0 - 8.5 g/dL 7.0 6.5 -  Total Bilirubin 0.0 - 1.2 mg/dL 0.4 0.6 -  Alkaline Phos 48 - 121 IU/L 63 91 -  AST 0 - 40 IU/L 17 19 -  ALT 0 - 44 IU/L 25 27 -   Lipid Panel     Component Value Date/Time   CHOL 200 (H) 07/11/2020 0954   TRIG 122 07/11/2020 0954   HDL 42 07/11/2020 0954   CHOLHDL 4.8 07/11/2020 0954   CHOLHDL 4.9 11/25/2010 0503   VLDL 24 11/25/2010 0503   LDLCALC 136 (H) 07/11/2020 0954    CBC    Component Value Date/Time   WBC 7.5 03/14/2020 1900   RBC 4.94 03/14/2020 1900   HGB 15.0 03/14/2020 1900   HCT 43.7 03/14/2020 1900   PLT 296 03/14/2020 1900   MCV 88.5 03/14/2020 1900   MCH 30.4 03/14/2020 1900   MCHC 34.3 03/14/2020 1900   RDW 12.1 03/14/2020 1900   LYMPHSABS 2.2 03/14/2020 1900   MONOABS 0.6 03/14/2020 1900   EOSABS 0.3 03/14/2020 1900   BASOSABS 0.1 03/14/2020  1900    ASSESSMENT AND PLAN: 1. Type 2 diabetes mellitus with obesity (HCC) Not at goal.  Dietary counseling given.  He is agreeable to seeing the nutritionist.  Increase metformin to twice a day. - POCT glucose (manual entry) - POCT glycosylated hemoglobin (Hb A1C) - metFORMIN (GLUCOPHAGE XR) 500 MG 24 hr tablet; Take 1 tablet (500 mg total) by mouth 2 times daily at 12 noon and 4 pm.  Dispense: 180 tablet; Refill: 3 - Amb ref to Medical Nutrition Therapy-MNT  2. Hyperlipidemia associated with type 2 diabetes mellitus (HCC) - atorvastatin (LIPITOR) 10 MG tablet; TAKE 1 TABLET (10 MG TOTAL) BY MOUTH DAILY.  Dispense: 30 tablet; Refill: 6  3. Screening for colon cancer Discussed colon cancer screening methods.  Patient prefers to have colonoscopy now that he has the orange card. - Ambulatory referral to Gastroenterology  4. Elevated blood pressure reading without diagnosis of hypertension DASH diet discussed and encouraged.  Follow-up with clinical pharmacist in 1 month for recheck.  If still not at goal, add lisinopril  5. Lumbar radiculopathy Now that he has the orange card, we can refer for MRI.  Further management will be based on results. - MR Lumbar Spine Wo Contrast; Future  Patient was given the opportunity to ask questions.  Patient verbalized understanding of the plan and was able to repeat key elements of the plan.  [employer] Language interpreter used during this encounter. #[id number] Hesselbach  Orders Placed This Encounter  Procedures  . MR Lumbar Spine Wo Contrast  . Ambulatory referral to Gastroenterology  . Amb ref to Medical Nutrition Therapy-MNT  . POCT glucose (manual entry)  . POCT glycosylated hemoglobin (Hb A1C)     Requested Prescriptions   Signed Prescriptions Disp Refills  . metFORMIN (GLUCOPHAGE XR) 500 MG 24 hr tablet 180 tablet 3    Sig: Take 1 tablet (500 mg total) by mouth 2 times daily at 12 noon and 4 pm.  . atorvastatin (LIPITOR) 10 MG tablet 30 tablet 6     Sig: TAKE 1 TABLET (10 MG TOTAL) BY MOUTH DAILY.    Return in about 4 months (around 07/21/2021) for GIve appt with [facility] in 1 mth for repeat BP check.  Jonah Blue, MD, FACP

## 2021-03-20 NOTE — Patient Instructions (Signed)
Diabetes mellitus y nutricin, en adultos Diabetes Mellitus and Nutrition, Adult Si sufre de diabetes, o diabetes mellitus, es muy importante tener hbitos alimenticios saludables debido a que sus niveles de azcar en la sangre (glucosa) se ven afectados en gran medida por lo que come y bebe. [place]er alimentos saludables en las cantidades correctas, aproximadamente a la misma hora todos los das, lo ayudar a: Controlar la glucemia. Disminuir el riesgo de sufrir una enfermedad cardaca. Mejorar la presin arterial. Alcanzar o mantener un peso saludable. Qu puede afectar mi plan de alimentacin? Todas las personas que sufren de diabetes son diferentes y cada una tiene necesidades diferentes en cuanto a un plan de alimentacin. El mdico puede recomendarle que trabaje con un nutricionista para elaborar el mejor plan para usted. Su plan de alimentacin puede variar segn factores como: Las caloras que necesita. Los medicamentos que toma. Su peso. Sus niveles de glucemia, presin arterial y colesterol. Su nivel de actividad. Otras afecciones que tenga, como enfermedades cardacas o renales. Cmo me afectan los carbohidratos? Los carbohidratos, o hidratos de carbono, afectan su nivel de glucemia ms que cualquier otro tipo de alimento. La ingesta de carbohidratos naturalmente aumenta la cantidad de glucosa en la sangre. El recuento de carbohidratos es un mtodo destinado a llevar un registro de la cantidad de carbohidratos que se consumen. El recuento de carbohidratos es importante para mantener la glucemia a un nivel saludable, especialmente si utiliza insulina o toma determinados medicamentos por va oral para la diabetes. Es importante conocer la cantidad de carbohidratos que se pueden ingerir en cada comida sin correr ningn riesgo. Esto es diferente en cada persona. Su nutricionista puede ayudarlo a calcular la cantidad de carbohidratos que debe ingerir en cada comida y en cada refrigerio. Cmo  me afecta el alcohol? El alcohol puede provocar disminuciones sbitas de la glucemia (hipoglucemia), especialmente si utiliza insulina o toma determinados medicamentos por va oral para la diabetes. La hipoglucemia es una afeccin potencialmente mortal. Los sntomas de la hipoglucemia, como somnolencia, mareos y confusin, son similares a los sntomas de haber consumido demasiado alcohol. No beba alcohol si: Su mdico le indica no hacerlo. Est embarazada, puede estar embarazada o est tratando de quedar embarazada. Si bebe alcohol: No beba con el estmago vaco. Limite la cantidad que bebe: De 0 a 1 medida por da para las mujeres. De 0 a 2 medidas por da para los hombres. Est atento a la cantidad de alcohol que hay en las bebidas que toma. En los [place], una medida equivale a una botella de cerveza de 12 oz (355 ml), un vaso de vino de 5 oz (148 ml) o un vaso de una bebida alcohlica de alta graduacin de 1 oz (44 ml). Mantngase hidratado bebiendo agua, refrescos dietticos o t helado sin azcar. Tenga en cuenta que los refrescos comunes, los jugos y otras bebida para mezclar pueden contener mucha azcar y se deben contar como carbohidratos. Consejos para seguir este plan Leer las etiquetas de los alimentos Comience por leer el tamao de la porcin en la "Informacin nutricional" en las etiquetas de los alimentos envasados y las bebidas. La cantidad de caloras, carbohidratos, grasas y otros nutrientes mencionados en la etiqueta se basan en una porcin del alimento. Muchos alimentos contienen ms de una porcin por envase. Verifique la cantidad total de gramos (g) de carbohidratos totales en una porcin. Puede calcular la cantidad de porciones de carbohidratos al dividir el total de carbohidratos por 15. Por ejemplo, si un alimento tiene un   total de 30 g de carbohidratos totales por porcin, equivale a 2 porciones de carbohidratos. Verifique la cantidad de gramos (g) de grasas  saturadas y grasas trans de una porcin. Escoja alimentos que no contengan estas grasas o que su contenido de estas sea bajo. Verifique la cantidad de miligramos (mg) de sal (sodio) en una porcin. La mayora de las personas deben limitar la ingesta de sodio total a menos de 2300 mg por da. Siempre consulte la informacin nutricional de los alimentos etiquetados como "con bajo contenido de grasa" o "sin grasa". Estos alimentos pueden tener un mayor contenido de azcar agregada o carbohidratos refinados, y deben evitarse. Hable con su nutricionista para identificar sus objetivos diarios en cuanto a los nutrientes mencionados en la etiqueta. Al ir de compras Evite comprar alimentos procesados, enlatados o precocidos. Estos alimentos tienden a tener una mayor cantidad de grasa, sodio y azcar agregada. Compre en la zona exterior de la tienda de comestibles. Esta es la zona donde se encuentran con mayor frecuencia las frutas y las verduras frescas, los cereales a granel, las carnes frescas y los productos lcteos frescos. Al cocinar Utilice mtodos de coccin a baja temperatura, como hornear, en lugar de mtodos de coccin a alta temperatura, como frer en abundante aceite. Cocine con aceites saludables, como el aceite de oliva, canola o girasol. Evite cocinar con manteca, crema o carnes con alto contenido de grasa. Planificacin de las comidas Coma las comidas y los refrigerios regularmente, preferentemente a la misma hora todos los das. Evite pasar largos perodos de tiempo sin comer. Consuma alimentos ricos en fibra, como frutas frescas, verduras, frijoles y cereales integrales. Consulte a su nutricionista sobre cuntas porciones de carbohidratos puede consumir en cada comida. Consuma entre 4 y 6 onzas (entre 112 y 168 g) de protenas magras por da, como carnes magras, pollo, pescado, huevos o tofu. Una onza (oz) de protena magra equivale a: 1 onza (28 g) de carne, pollo o pescado. 1 huevo.  de  taza (62 g) de tofu. Coma algunos alimentos por da que contengan grasas saludables, como aguacates, frutos secos, semillas y pescado. Qu alimentos debo comer? Frutas Bayas. Manzanas. Naranjas. Duraznos. Damascos. Ciruelas. Uvas. Mango. Papaya. Granada. Kiwi. Cerezas. Verduras Lechuga. Espinaca. Verduras de hoja verde, que incluyen col rizada, acelga, hojas de berza y de mostaza. Remolachas. Coliflor. Repollo. Brcoli. Zanahorias. Judas verdes. Tomates. Pimientos. Cebollas. Pepinos. Coles de Bruselas. Granos Granos integrales, como panes, galletas, tortillas, cereales y pastas de salvado o integrales. Avena sin azcar. Quinua. Arroz integral o salvaje. Carnes y otras protenas Mariscos. Carne de ave sin piel. Cortes magros de ave y carne de res. Tofu. Frutos secos. Semillas. Lcteos Productos lcteos sin grasa o con bajo contenido de grasa, como leche, yogur y queso. Es posible que los productos que se enumeran ms arriba no constituyan una lista completa de los alimentos y las bebidas que puede tomar. Consulte a un nutricionista para obtener ms informacin. Qu alimentos debo evitar? Frutas Frutas enlatadas al almbar. Verduras Verduras enlatadas. Verduras congeladas con mantequilla o salsa de crema. Granos Productos elaborados con harina y harina blanca refinada, como panes, pastas, bocadillos y cereales. Evite todos los alimentos procesados. Carnes y otras protenas Cortes de carne con alto contenido de grasa. Carne de ave con piel. Carnes empanizadas o fritas. Carne procesada. Evite las grasas saturadas. Lcteos Yogur, queso o leche enteros. Bebidas Bebidas azucaradas, como gaseosas o t helado. Es posible que los productos que se enumeran ms arriba no constituyan una lista completa de   los alimentos y las bebidas que debe evitar. Consulte a un nutricionista para obtener ms informacin. Preguntas para hacerle al mdico Es necesario que me rena con un instructor en el cuidado  de la diabetes? Es necesario que me rena con un nutricionista? A qu nmero puedo llamar si tengo preguntas? Cules son los mejores momentos para controlar la glucemia? Dnde encontrar ms informacin: Asociacin Estadounidense de la Diabetes (American Diabetes Association): diabetes.org Academy of Nutrition and Dietetics (Academia de Nutricin y Diettica): www.eatright.org National Institute of Diabetes and Digestive and Kidney Diseases (Instituto Nacional de la Diabetes y las Enfermedades Digestivas y Renales): www.niddk.nih.gov Association of Diabetes Care and Education Specialists (Asociacin de Especialistas en Atencin y Educacin sobre la Diabetes): www.diabeteseducator.org Resumen Es importante tener hbitos alimenticios saludables debido a que sus niveles de azcar en la sangre (glucosa) se ven afectados en gran medida por lo que come y bebe. Un plan de alimentacin saludable lo ayudar a controlar la glucemia y mantener un estilo de vida saludable. El mdico puede recomendarle que trabaje con un nutricionista para elaborar el mejor plan para usted. Tenga en cuenta que los carbohidratos (hidratos de carbono) y el alcohol tienen efectos inmediatos en sus niveles de glucemia. Es importante contar los carbohidratos que ingiere y consumir alcohol con prudencia. Esta informacin no tiene como fin reemplazar el consejo del mdico. Asegrese de hacerle al mdico cualquier pregunta que tenga. Document Revised: 11/22/2019 Document Reviewed: 11/22/2019 Elsevier Patient Education  2021 Elsevier Inc.  

## 2021-04-06 ENCOUNTER — Ambulatory Visit (HOSPITAL_COMMUNITY)
Admission: RE | Admit: 2021-04-06 | Discharge: 2021-04-06 | Disposition: A | Discharge status: Home / Self Care | Payer: Self-pay | Source: Ambulatory Visit | Attending: Internal Medicine | Admitting: Internal Medicine

## 2021-04-06 ENCOUNTER — Other Ambulatory Visit: Payer: Self-pay

## 2021-04-06 DIAGNOSIS — M5416 Radiculopathy, lumbar region: Secondary | ICD-10-CM | POA: Insufficient documentation

## 2021-04-07 ENCOUNTER — Other Ambulatory Visit: Payer: Self-pay | Admitting: Internal Medicine

## 2021-04-07 DIAGNOSIS — M5416 Radiculopathy, lumbar region: Secondary | ICD-10-CM

## 2021-04-07 NOTE — Progress Notes (Signed)
Let pt know that MRI showed bulging disc along with narrowing of the bones around the spinal cord in the lower back.  Will refer to spine specialist.

## 2021-04-13 ENCOUNTER — Ambulatory Visit: Payer: Self-pay

## 2021-04-13 ENCOUNTER — Other Ambulatory Visit: Payer: Self-pay

## 2021-04-13 ENCOUNTER — Ambulatory Visit (INDEPENDENT_AMBULATORY_CARE_PROVIDER_SITE_OTHER): Payer: Self-pay | Admitting: Orthopaedic Surgery

## 2021-04-13 VITALS — BP 144/81 | HR 76 | Ht 66.0 in | Wt 212.0 lb

## 2021-04-13 DIAGNOSIS — M545 Low back pain, unspecified: Secondary | ICD-10-CM

## 2021-04-13 DIAGNOSIS — M48062 Spinal stenosis, lumbar region with neurogenic claudication: Secondary | ICD-10-CM

## 2021-04-13 DIAGNOSIS — M5126 Other intervertebral disc displacement, lumbar region: Secondary | ICD-10-CM

## 2021-04-13 DIAGNOSIS — G8929 Other chronic pain: Secondary | ICD-10-CM

## 2021-04-13 NOTE — Progress Notes (Signed)
Office Visit Note   Patient: Allen Lowery           Date of Birth: 1960/12/16           MRN: [id number] Visit Date: 04/13/2021              Requested by: Marcine Matar, MD [address] [place],  [place] [address] PCP: Marcine Matar, MD   Assessment & Plan: Visit Diagnoses:  1. Chronic right-sided low back pain, unspecified whether sciatica present   2. Protrusion of lumbar intervertebral disc   3. Spinal stenosis of lumbar region with neurogenic claudication     Plan: Patient is single level large disc protrusion multifactorial stenosis now developed significant neurogenic claudication symptoms.  He has severe spinal stenosis and plan would be single level decompression surgery.  This would be at the L2-3 level using MRI numbering from 04/02/2021.  Plan would be overnight stay.  Follow-Up Instructions: No follow-ups on file.   Orders:  Orders Placed This Encounter  Procedures   XR Lumbar Spine 2-3 Views   XR Lumb Spine Flex&Ext Only   No orders of the defined types were placed in this encounter.     Procedures: No procedures performed   Clinical Data: No additional findings.   Subjective: Chief Complaint  Patient presents with   Lower Back - Pain    HPI 60 year old Hispanic here with interpreter with type 2 diabetes and 4-year history since he worked with back pain.  He has developed severe pain and inability to walk more than 100 feet without having to stop and sit down.  He gets relief after sitting for about 10 minutes he states he can get up and slowly walk for another for 5 minutes and then has to stop sit.  He does have type 2 diabetes last A1c was 7.3 that was in May.  His BMI is elevated some at 34.  He denies any associated bowel or bladder symptoms.  Patient has transitional anatomy in the lumbar spine.  Radiologist labeled disc based on a small disc in the sacrum and labeled L2-3 spondylolisthesis with large disc protrusion and spinal  stenosis multifactorial which on plain radiographs with normally be labeled L3-4.  Disc protrusion and stenosis at the level where he has some spondylolisthesis.  Patient taken anti-inflammatories, Neurontin without relief.  Not placed on prednisone Dosepak due to his diabetes.  A1c was elevated 1 year ago at 12 now currently 7.3.  Patient is also been on ibuprofen.  Review of Systems positive type 2 diabetes not on insulin.  Patient is on metformin.   Objective: Vital Signs: BP (!) 144/81   Pulse 76   Ht 5\' 6"  (1.676 m)   Wt 212 lb (96.2 kg)   BMI 34.22 kg/m   Physical Exam Constitutional:      Appearance: He is well-developed.  HENT:     Head: Normocephalic and atraumatic.     Right Ear: External ear normal.     Left Ear: External ear normal.  Eyes:     Pupils: Pupils are equal, round, and reactive to light.  Neck:     Thyroid: No thyromegaly.     Trachea: No tracheal deviation.  Cardiovascular:     Rate and Rhythm: Normal rate.  Pulmonary:     Effort: Pulmonary effort is normal.     Breath sounds: No wheezing.  Abdominal:     General: Bowel sounds are normal.     Palpations: Abdomen  is soft.  Musculoskeletal:     Cervical back: Neck supple.  Skin:    General: Skin is warm and dry.     Capillary Refill: Capillary refill takes less than 2 seconds.  Neurological:     Mental Status: He is alert and oriented to person, place, and time.  Psychiatric:        Behavior: Behavior normal.        Thought Content: Thought content normal.        Judgment: Judgment normal.    Ortho Exam patient has intact anterior tib.  Gastrocsoleus is intact.  No plantar foot lesions.  Normal pulses decree sensation right anterior thigh extending down to the right dorsum of foot.  Pedal pulses are intact.  He has some decreased quad strength right versus left.  Specialty Comments:  No specialty comments available.  Imaging: CLINICAL DATA:  Unable to walk more than 5 minutes. Better  with sitting.   EXAM: MRI LUMBAR SPINE WITHOUT CONTRAST   TECHNIQUE: Multiplanar, multisequence MR imaging of the lumbar spine was performed. No intravenous contrast was administered.   COMPARISON:  None.   FINDINGS: Segmentation: Transitional anatomy with sacralization of the L5 vertebral body. Please refer to the enumerated sagittal MRI sequence prior to any intervention.   Alignment: Grade 1 anterolisthesis of L2 on L3 secondary to facet disease.   Vertebrae:  No fracture, evidence of discitis, or bone lesion.   Conus medullaris and cauda equina: Conus extends to the L1 level. Conus and cauda equina appear normal. Sacral Tarlov cyst.   Paraspinal and other soft tissues: No acute paraspinal abnormality.   Disc levels:   Disc spaces: Degenerative disease with disc height loss at T10-11, L2-3, L3-4 and L4-5.   T10-11: Broad-based disc bulge. Mild bilateral facet arthropathy. Mild bilateral foraminal stenosis.   T11-12: Tiny central disc protrusion. No foraminal or central canal stenosis.   T12-L1: No significant disc bulge. Mild bilateral facet arthropathy. No evidence of neural foraminal stenosis. No central canal stenosis.   L1-L2: No significant disc bulge. No evidence of neural foraminal stenosis. No central canal stenosis. Mild bilateral facet arthropathy.   L2-L3: Large broad-based disc bulge with a broad central disc protrusion. Severe bilateral facet arthropathy. Severe spinal stenosis. Moderate bilateral foraminal stenosis.   L3-L4: Broad-based disc bulge. Mild bilateral facet arthropathy. Bilateral subarticular recess stenosis. Moderate bilateral foraminal stenosis. No central canal stenosis.   L4-L5: Broad-based disc bulge. Severe right and moderate left foraminal stenosis. No central canal stenosis.   L5-S1: No significant disc bulge. No evidence of neural foraminal stenosis. No central canal stenosis.   IMPRESSION: 1. At L2-3 there is a large  broad-based disc bulge with a broad central disc protrusion. Severe bilateral facet arthropathy. Severe spinal stenosis. 2. Moderate bilateral foraminal stenosis. At L3-4 there is a broad-based disc bulge. Mild bilateral facet arthropathy. Bilateral subarticular recess stenosis. At L4-5 there is a broad-based disc bulge. Severe right and moderate left foraminal stenosis.     Electronically Signed   By: Elige Ko   On: 04/07/2021 07:38 Lateral flexion-extension lumbar x-rays are obtained and reviewed.  This shows some disc space narrowing throughout the lumbar region with anterolisthesis at L2-3 using MRI numbering from 04/02/2021.  Transitional anatomy noted at the lumbosacral junction.  Impression: L2-3 anterolisthesis with posterior longitudinal ligament partial calcification without change on flexion-extension radiographs.  PMFS History: Patient Active Problem List   Diagnosis Date Noted   Protrusion of lumbar intervertebral disc 04/15/2021   Spinal  stenosis of lumbar region 04/15/2021   Lumbar radiculopathy 12/04/2020   Health care maintenance 05/01/2020   T2DM (type 2 diabetes mellitus) (HCC) 04/01/2020   Blurred vision, right eye 04/01/2020   Ear abrasion 04/01/2020   Bell's palsy 03/15/2020   Past Medical History:  Diagnosis Date   Diabetes mellitus without complication (HCC)     Family History  Problem Relation Age of Onset   Diabetes Mother    Heart disease Neg Hx    Hypertension Neg Hx    Cancer Neg Hx     Past Surgical History:  Procedure Laterality Date   KIDNEY STONE SURGERY     kidney stones     Social History   Occupational History   Not on file  Tobacco Use   Smoking status: Never   Smokeless tobacco: Never  Substance and Sexual Activity   Alcohol use: No   Drug use: No   Sexual activity: Not on file

## 2021-04-15 DIAGNOSIS — M48061 Spinal stenosis, lumbar region without neurogenic claudication: Secondary | ICD-10-CM | POA: Insufficient documentation

## 2021-04-15 DIAGNOSIS — M5126 Other intervertebral disc displacement, lumbar region: Secondary | ICD-10-CM | POA: Insufficient documentation

## 2021-04-22 ENCOUNTER — Telehealth: Payer: Self-pay

## 2021-04-22 NOTE — Telephone Encounter (Signed)
Arna Medici could you please reach out to them

## 2021-04-22 NOTE — Telephone Encounter (Signed)
Copied from CRM [id number]. Topic: General - Other >> Apr 20, 2021 11:12 AM Gwenlyn Fudge wrote: Reason for CRM: Pam, from [facility] health nutrition and diabetes, called stating that the referral for the pt is missing a diagnosis. She states that they cannot see pt without this. Please advise.

## 2021-04-22 NOTE — Telephone Encounter (Signed)
Will forward to provider  

## 2021-04-22 NOTE — Progress Notes (Signed)
Surgical Instructions    Your procedure is scheduled on 04/27/21.  Report to [facility] Main Entrance "A" at 10:30 A.M., then check in with the Admitting office.  Call this number if you have problems the morning of surgery:  [phone]   If you have any questions prior to your surgery date call [phone]: Open Monday-Friday 8am-4pm    Remember:  Do not eat or drink after midnight the night before your surgery    Take these medicines the morning of surgery with A SIP OF WATER: atorvastatin (LIPITOR)  gabapentin (NEURONTIN) methocarbamol (ROBAXIN) - if needed   As of today, STOP taking any Aspirin (unless otherwise instructed by your surgeon) Aleve, Naproxen, Ibuprofen, Motrin, Advil, Goody's, BC's, all herbal medications, fish oil, and all vitamins.  WHAT DO I DO ABOUT MY DIABETES MEDICATION?   Do not take oral diabetes medicines (pills) the morning of surgery.  If your CBG is greater than 220 mg/dL, you may take  of your sliding scale (correction) dose of insulin.   HOW TO MANAGE YOUR DIABETES BEFORE AND AFTER SURGERY  Why is it important to control my blood sugar before and after surgery? Improving blood sugar levels before and after surgery helps healing and can limit problems. A way of improving blood sugar control is eating a healthy diet by:  Eating less sugar and carbohydrates  Increasing activity/exercise  Talking with your doctor about reaching your blood sugar goals High blood sugars (greater than 180 mg/dL) can raise your risk of infections and slow your recovery, so you will need to focus on controlling your diabetes during the weeks before surgery. Make sure that the doctor who takes care of your diabetes knows about your planned surgery including the date and location.  How do I manage my blood sugar before surgery? Check your blood sugar at least 4 times a day, starting 2 days before surgery, to make sure that the level is not too high or low.  Check  your blood sugar the morning of your surgery when you wake up and every 2 hours until you get to the Short Stay unit.  If your blood sugar is less than 70 mg/dL, you will need to treat for low blood sugar: Do not take insulin. Treat a low blood sugar (less than 70 mg/dL) with  cup of clear juice (cranberry or apple), 4 glucose tablets, OR glucose gel. Recheck blood sugar in 15 minutes after treatment (to make sure it is greater than 70 mg/dL). If your blood sugar is not greater than 70 mg/dL on recheck, call [phone] for further instructions. Report your blood sugar to the short stay nurse when you get to Short Stay.  If you are admitted to the hospital after surgery: Your blood sugar will be checked by the staff and you will probably be given insulin after surgery (instead of oral diabetes medicines) to make sure you have good blood sugar levels. The goal for blood sugar control after surgery is 80-180 mg/dL.           Do not wear jewelry  Do not wear lotions, powders, colognes, or deodorant. Do not shave 48 hours prior to surgery.  Men may shave face and neck. Do not bring valuables to the hospital.              [facility] is not responsible for any belongings or valuables.  Do NOT Smoke (Tobacco/Vaping) or drink Alcohol 24 hours prior to your procedure If you use a CPAP  at night, you may bring all equipment for your overnight stay.   Contacts, glasses, dentures or bridgework may not be worn into surgery, please bring cases for these belongings   For patients admitted to the hospital, discharge time will be determined by your treatment team.   Patients discharged the day of surgery will not be allowed to drive home, and someone needs to stay with them for 24 hours.  ONLY 1 SUPPORT PERSON MAY BE PRESENT WHILE YOU ARE IN SURGERY. IF YOU ARE TO BE ADMITTED ONCE YOU ARE IN YOUR ROOM YOU WILL BE ALLOWED TWO (2) VISITORS.  Minor children may have two parents present. Special  consideration for safety and communication needs will be reviewed on a case by case basis.  Special instructions:    Oral Hygiene is also important to reduce your risk of infection.  Remember - BRUSH YOUR TEETH THE MORNING OF SURGERY WITH YOUR REGULAR TOOTHPASTE   [place] [facility]- Preparing For Surgery  Before surgery, you can play an important role. Because skin is not sterile, your skin needs to be as free of germs as possible. You can reduce the number of germs on your skin by washing with CHG (chlorahexidine gluconate) Soap before surgery.  CHG is an antiseptic cleaner which kills germs and bonds with the skin to continue killing germs even after washing.     Please do not use if you have an allergy to CHG or antibacterial soaps. If your skin becomes reddened/irritated stop using the CHG.  Do not shave (including legs and underarms) for at least 48 hours prior to first CHG shower. It is OK to shave your face.  Please follow these instructions carefully.     Shower the NIGHT BEFORE SURGERY and the MORNING OF SURGERY with CHG Soap.   If you chose to wash your hair, wash your hair first as usual with your normal shampoo. After you shampoo, rinse your hair and body thoroughly to remove the shampoo.  Then [employer] and genitals (private parts) with your normal soap and rinse thoroughly to remove soap.  After that Use CHG Soap as you would any other liquid soap. You can apply CHG directly to the skin and wash gently with a scrungie or a clean washcloth.   Apply the CHG Soap to your body ONLY FROM THE NECK DOWN.  Do not use on open wounds or open sores. Avoid contact with your eyes, ears, mouth and genitals (private parts). Wash Face and genitals (private parts)  with your normal soap.   Wash thoroughly, paying special attention to the area where your surgery will be performed.  Thoroughly rinse your body with warm water from the neck down.  DO NOT shower/wash with your normal soap after using  and rinsing off the CHG Soap.  Pat yourself dry with a CLEAN TOWEL.  Wear CLEAN PAJAMAS to bed the night before surgery  Place CLEAN SHEETS on your bed the night before your surgery  DO NOT SLEEP WITH PETS.   Day of Surgery:  Take a shower with CHG soap. Wear Clean/Comfortable clothing the morning of surgery Do not apply any deodorants/lotions.   Remember to brush your teeth WITH YOUR REGULAR TOOTHPASTE.   Please read over the following fact sheets that you were given.

## 2021-04-23 ENCOUNTER — Encounter: Payer: Self-pay | Admitting: Surgery

## 2021-04-23 ENCOUNTER — Inpatient Hospital Stay (HOSPITAL_COMMUNITY)
Admission: RE | Admit: 2021-04-23 | Discharge: 2021-04-23 | Disposition: A | Discharge status: Home / Self Care | Payer: Self-pay | Source: Ambulatory Visit

## 2021-04-23 ENCOUNTER — Other Ambulatory Visit: Payer: Self-pay

## 2021-04-23 ENCOUNTER — Ambulatory Visit (INDEPENDENT_AMBULATORY_CARE_PROVIDER_SITE_OTHER): Payer: Self-pay | Admitting: Surgery

## 2021-04-23 VITALS — BP 138/86 | HR 69 | Ht 66.0 in | Wt 212.0 lb

## 2021-04-23 DIAGNOSIS — M48062 Spinal stenosis, lumbar region with neurogenic claudication: Secondary | ICD-10-CM

## 2021-04-23 DIAGNOSIS — M5126 Other intervertebral disc displacement, lumbar region: Secondary | ICD-10-CM

## 2021-04-24 ENCOUNTER — Encounter: Payer: Self-pay | Admitting: Surgery

## 2021-04-24 ENCOUNTER — Encounter (HOSPITAL_COMMUNITY): Payer: Self-pay

## 2021-04-24 ENCOUNTER — Other Ambulatory Visit: Payer: Self-pay

## 2021-04-24 ENCOUNTER — Encounter (HOSPITAL_COMMUNITY)
Admission: RE | Admit: 2021-04-24 | Discharge: 2021-04-24 | Disposition: A | Discharge status: Home / Self Care | Payer: Self-pay | Source: Ambulatory Visit | Attending: Orthopaedic Surgery | Admitting: Orthopaedic Surgery

## 2021-04-24 DIAGNOSIS — Z20822 Contact with and (suspected) exposure to covid-19: Secondary | ICD-10-CM | POA: Insufficient documentation

## 2021-04-24 DIAGNOSIS — Z01818 Encounter for other preprocedural examination: Secondary | ICD-10-CM | POA: Insufficient documentation

## 2021-04-24 HISTORY — DX: Personal history of urinary calculi: Z87.442

## 2021-04-24 LAB — CBC
HCT: 45.1 % (ref 39.0–52.0)
Hemoglobin: 14.8 g/dL (ref 13.0–17.0)
MCH: 30 pg (ref 26.0–34.0)
MCHC: 32.8 g/dL (ref 30.0–36.0)
MCV: 91.5 fL (ref 80.0–100.0)
Platelets: 291 10*3/uL (ref 150–400)
RBC: 4.93 MIL/uL (ref 4.22–5.81)
RDW: 12.6 % (ref 11.5–15.5)
WBC: 9.2 10*3/uL (ref 4.0–10.5)
nRBC: 0 % (ref 0.0–0.2)

## 2021-04-24 LAB — SARS CORONAVIRUS 2 (TAT 6-24 HRS): SARS Coronavirus 2: NEGATIVE

## 2021-04-24 LAB — BASIC METABOLIC PANEL
Anion gap: 6 (ref 5–15)
BUN: 14 mg/dL (ref 6–20)
CO2: 28 mmol/L (ref 22–32)
Calcium: 9.4 mg/dL (ref 8.9–10.3)
Chloride: 105 mmol/L (ref 98–111)
Creatinine, Ser: 0.82 mg/dL (ref 0.61–1.24)
GFR, Estimated: >60 mL/min (ref >60)
Glucose, Bld: 112 mg/dL — ABNORMAL HIGH (ref 70–99)
Potassium: 4.2 mmol/L (ref 3.5–5.1)
Sodium: 139 mmol/L (ref 135–145)

## 2021-04-24 LAB — SURGICAL PCR SCREEN
MRSA, PCR: NEGATIVE
Staphylococcus aureus: NEGATIVE

## 2021-04-24 LAB — GLUCOSE, CAPILLARY: Glucose-Capillary: 124 mg/dL — ABNORMAL HIGH (ref 70–99)

## 2021-04-24 NOTE — Pre-Procedure Instructions (Signed)
Surgical Instructions:    Your procedure is scheduled on Monday, June 27th.  Report to [facility] Main Entrance "A" at 10:30 A.M., then check in with the Admitting office.  Call this number if you have any questions prior to your surgery date, or have problems the morning of surgery:  [phone]    Remember:  Do not eat after midnight the night before your surgery.  You may drink clear liquids until 09:30 AM the morning of your surgery.   Clear liquids allowed are: Water, Non-Citrus Juices (without pulp), Carbonated Beverages, Clear Tea, Black Coffee Only, and Gatorade.    Take these medicines the morning of surgery with A SIP OF WATER:   atorvastatin (LIPITOR) gabapentin (NEURONTIN)  IF NEEDED: methocarbamol (ROBAXIN)   As of today, STOP taking any Aspirin (unless otherwise instructed by your surgeon) Aleve, Naproxen, Ibuprofen, Motrin, Advil, Goody's, BC's, all herbal medications, fish oil, and all vitamins.   WHAT DO I DO ABOUT MY DIABETES MEDICATION?  THE MORNING OF SURGERY, do not take metFORMIN (GLUCOPHAGE XR). If your CBG is greater than 220, you may take  of your sliding scale (correction) dose of insulin aspart (NOVOLOG).    HOW TO MANAGE YOUR DIABETES BEFORE AND AFTER SURGERY  Why is it important to control my blood sugar before and after surgery? Improving blood sugar levels before and after surgery helps healing and can limit problems. A way of improving blood sugar control is eating a healthy diet by:  Eating less sugar and carbohydrates  Increasing activity/exercise  Talking with your doctor about reaching your blood sugar goals High blood sugars (greater than 180 mg/dL) can raise your risk of infections and slow your recovery, so you will need to focus on controlling your diabetes during the weeks before surgery. Make sure that the doctor who takes care of your diabetes knows about your planned surgery including the date and location.  How do I manage my  blood sugar before surgery? Check your blood sugar at least 4 times a day, starting 2 days before surgery, to make sure that the level is not too high or low.  Check your blood sugar the morning of your surgery when you wake up and every 2 hours until you get to the Short Stay unit.  If your blood sugar is less than 70 mg/dL, you will need to treat for low blood sugar: Do not take insulin. Treat a low blood sugar (less than 70 mg/dL) with  cup of clear juice (cranberry or apple), 4 glucose tablets, OR glucose gel. Recheck blood sugar in 15 minutes after treatment (to make sure it is greater than 70 mg/dL). If your blood sugar is not greater than 70 mg/dL on recheck, call [phone] for further instructions. Report your blood sugar to the short stay nurse when you get to Short Stay.  If you are admitted to the hospital after surgery: Your blood sugar will be checked by the staff and you will probably be given insulin after surgery (instead of oral diabetes medicines) to make sure you have good blood sugar levels. The goal for blood sugar control after surgery is 80-180 mg/dL.    Special instructions:    [facility]- Preparing For Surgery  Before surgery, you can play an important role. Because skin is not sterile, your skin needs to be as free of germs as possible. You can reduce the number of germs on your skin by washing with CHG (chlorahexidine gluconate) Soap before surgery.  CHG is  an antiseptic cleaner which kills germs and bonds with the skin to continue killing germs even after washing.     Please do not use if you have an allergy to CHG or antibacterial soaps. If your skin becomes reddened/irritated stop using the CHG.  Do not shave (including legs and underarms) for at least 48 hours prior to first CHG shower. It is OK to shave your face.  Please follow these instructions carefully.     Shower the NIGHT BEFORE SURGERY and the MORNING OF SURGERY with CHG Soap.   If you chose  to wash your hair, wash your hair first as usual with your normal shampoo. After you shampoo, rinse your hair and body thoroughly to remove the shampoo.  Then [employer] and genitals (private parts) with your normal soap and rinse thoroughly to remove soap.  After that Use CHG Soap as you would any other liquid soap. You can apply CHG directly to the skin and wash gently with a scrungie or a clean washcloth.   Apply the CHG Soap to your body ONLY FROM THE NECK DOWN.  Do not use on open wounds or open sores. Avoid contact with your eyes, ears, mouth and genitals (private parts). Wash Face and genitals (private parts)  with your normal soap.   Wash thoroughly, paying special attention to the area where your surgery will be performed.  Thoroughly rinse your body with warm water from the neck down.  DO NOT shower/wash with your normal soap after using and rinsing off the CHG Soap.  Pat yourself dry with a CLEAN TOWEL.  Wear CLEAN PAJAMAS to bed the night before surgery  Place CLEAN SHEETS on your bed the night before your surgery  DO NOT SLEEP WITH PETS.   Day of Surgery:  Take a shower with CHG soap. Wear Clean/Comfortable clothing the morning of surgery Do not wear lotions, powders, colognes, or deodorant.   Remember to brush your teeth WITH YOUR REGULAR TOOTHPASTE. Do not wear jewelry. Men may shave face and neck. Do not bring valuables to the hospital. [facility] is not responsible for any belongings or valuables.  Do NOT Smoke (Tobacco/Vaping) or drink Alcohol 24 hours prior to your procedure.  If you use a CPAP at night, you may bring all equipment for your overnight stay.   Contacts, glasses, dentures or bridgework may not be worn into surgery, please bring cases for these belongings.   For patients admitted to the hospital, discharge time will be determined by your treatment team.  Patients discharged the day of surgery will not be allowed to drive home, and someone needs to  stay with them for 24 hours.  ONLY 1 SUPPORT PERSON MAY BE PRESENT WHILE YOU ARE IN SURGERY. IF YOU ARE TO BE ADMITTED ONCE YOU ARE IN YOUR ROOM YOU WILL BE ALLOWED TWO (2) VISITORS.  Minor children may have two parents present. Special consideration for safety and communication needs will be reviewed on a case by case basis.     Please read over the following fact sheets that you were given.

## 2021-04-24 NOTE — Progress Notes (Signed)
IBM'd Allen Lowery for procedure orders.

## 2021-04-24 NOTE — Progress Notes (Signed)
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  He is accompanied by interpreter.  Patient states that back pain and lower extremity radiculopathy unchanged from previous visit.  He is wanting to proceed with L2-3 decompression and microdiscectomy as scheduled.  Today history and physical performed.  Review of systems negative.  Surgical procedure discussed in detail along with potential hospital stay.  All questions answered and he wishes to proceed.

## 2021-04-24 NOTE — Progress Notes (Signed)
Interpreter, Scarlette Calico present @ PAT appointment.   PCP - Marcine Matar, MD Cardiologist - Denies  PPM/ICD - Denies  Chest x-ray - N/A EKG - 04/24/21 Stress Test - Denies ECHO - Denies Cardiac Cath - Denies  Sleep Study - Yes negative for OSA  Fasting Blood Sugar - 125-143 Checks Blood Sugar X1 Daily. Last A1c 7.3 03/20/21. CBG 124 at PAT appointment.  Blood Thinner Instructions: N/A Aspirin Instructions: N/A  ERAS Protcol - Yes PRE-SURGERY Ensure or G2- Not ordered  COVID TEST- 04/24/21   Anesthesia review: No  Patient denies shortness of breath, fever, cough and chest pain at PAT appointment   All instructions explained to the patient, with a verbal understanding of the material. Patient agrees to go over the instructions while at home for a better understanding. Patient also instructed to self quarantine after being tested for COVID-19. The opportunity to ask questions was provided.

## 2021-04-27 ENCOUNTER — Other Ambulatory Visit: Payer: Self-pay

## 2021-04-27 ENCOUNTER — Ambulatory Visit (HOSPITAL_COMMUNITY): Payer: Self-pay | Admitting: Certified Registered"

## 2021-04-27 ENCOUNTER — Ambulatory Visit (HOSPITAL_COMMUNITY): Payer: Self-pay

## 2021-04-27 ENCOUNTER — Ambulatory Visit (HOSPITAL_COMMUNITY): Payer: Self-pay | Admitting: Vascular Surgery

## 2021-04-27 ENCOUNTER — Encounter (HOSPITAL_COMMUNITY)
Admission: RE | Disposition: A | Discharge status: Home / Self Care | Payer: Self-pay | Source: Home / Self Care | Attending: Orthopaedic Surgery

## 2021-04-27 ENCOUNTER — Observation Stay (HOSPITAL_COMMUNITY)
Admission: RE | Admit: 2021-04-27 | Discharge: 2021-04-28 | Disposition: A | Discharge status: Home / Self Care | Payer: Self-pay | Attending: Orthopaedic Surgery | Admitting: Orthopaedic Surgery

## 2021-04-27 ENCOUNTER — Encounter (HOSPITAL_COMMUNITY): Payer: Self-pay | Admitting: Orthopaedic Surgery

## 2021-04-27 DIAGNOSIS — E119 Type 2 diabetes mellitus without complications: Secondary | ICD-10-CM | POA: Insufficient documentation

## 2021-04-27 DIAGNOSIS — M48062 Spinal stenosis, lumbar region with neurogenic claudication: Principal | ICD-10-CM | POA: Insufficient documentation

## 2021-04-27 DIAGNOSIS — Z419 Encounter for procedure for purposes other than remedying health state, unspecified: Secondary | ICD-10-CM

## 2021-04-27 DIAGNOSIS — M48061 Spinal stenosis, lumbar region without neurogenic claudication: Secondary | ICD-10-CM | POA: Diagnosis present

## 2021-04-27 DIAGNOSIS — M5126 Other intervertebral disc displacement, lumbar region: Secondary | ICD-10-CM

## 2021-04-27 HISTORY — PX: LUMBAR LAMINECTOMY: SHX95

## 2021-04-27 LAB — GLUCOSE, CAPILLARY
Glucose-Capillary: 122 mg/dL — ABNORMAL HIGH (ref 70–99)
Glucose-Capillary: 112 mg/dL — ABNORMAL HIGH (ref 70–99)
Glucose-Capillary: 116 mg/dL — ABNORMAL HIGH (ref 70–99)
Glucose-Capillary: 170 mg/dL — ABNORMAL HIGH (ref 70–99)
Glucose-Capillary: 227 mg/dL — ABNORMAL HIGH (ref 70–99)

## 2021-04-27 SURGERY — MICRODISCECTOMY LUMBAR LAMINECTOMY
Anesthesia: General | Site: Spine Lumbar

## 2021-04-27 MED ORDER — SUCCINYLCHOLINE CHLORIDE 200 MG/10ML IV SOSY
PREFILLED_SYRINGE | INTRAVENOUS | Status: AC
  Filled 2021-04-27: qty 10

## 2021-04-27 MED ORDER — SODIUM CHLORIDE 0.9% FLUSH: 3.0000 mL | INTRAVENOUS | Status: DC | PRN

## 2021-04-27 MED ORDER — CEFAZOLIN SODIUM-DEXTROSE 2-4 GM/100ML-% IV SOLN
2.0000 g | INTRAVENOUS | Status: AC
  Administered 2021-04-27: 2 g via INTRAVENOUS
  Filled 2021-04-27: qty 100

## 2021-04-27 MED ORDER — OXYCODONE HCL 5 MG PO TABS
5.0000 mg | ORAL_TABLET | ORAL | Status: DC | PRN
Start: 2021-04-27 — End: 2021-04-28
  Administered 2021-04-27 – 2021-04-28 (×4): 5 mg via ORAL
  Filled 2021-04-27 (×4): qty 1

## 2021-04-27 MED ORDER — LIDOCAINE 2% (20 MG/ML) 5 ML SYRINGE
INTRAMUSCULAR | Status: DC | PRN
  Administered 2021-04-27: 80 mg via INTRAVENOUS

## 2021-04-27 MED ORDER — 0.9 % SODIUM CHLORIDE (POUR BTL) OPTIME
TOPICAL | Status: DC | PRN
  Administered 2021-04-27: 1000 mL

## 2021-04-27 MED ORDER — SUGAMMADEX SODIUM 200 MG/2ML IV SOLN
INTRAVENOUS | Status: DC | PRN
  Administered 2021-04-27: 200 mg via INTRAVENOUS

## 2021-04-27 MED ORDER — ROCURONIUM BROMIDE 10 MG/ML (PF) SYRINGE
PREFILLED_SYRINGE | INTRAVENOUS | Status: AC
  Filled 2021-04-27: qty 20

## 2021-04-27 MED ORDER — ONDANSETRON HCL 4 MG PO TABS: 4.0000 mg | ORAL_TABLET | Freq: Four times a day (QID) | ORAL | Status: DC | PRN

## 2021-04-27 MED ORDER — HYDROMORPHONE HCL 1 MG/ML IJ SOLN: 0.2500 mg | INTRAMUSCULAR | Status: DC | PRN

## 2021-04-27 MED ORDER — ACETAMINOPHEN 650 MG RE SUPP: 650.0000 mg | RECTAL | Status: DC | PRN

## 2021-04-27 MED ORDER — INSULIN ASPART 100 UNIT/ML IJ SOLN
0.0000 [IU] | Freq: Three times a day (TID) | INTRAMUSCULAR | Status: DC
  Administered 2021-04-27: 3 [IU] via SUBCUTANEOUS

## 2021-04-27 MED ORDER — METFORMIN HCL ER 500 MG PO TB24
500.0000 mg | ORAL_TABLET | Freq: Two times a day (BID) | ORAL | Status: DC
  Administered 2021-04-27 – 2021-04-28 (×2): 500 mg via ORAL
  Filled 2021-04-27 (×2): qty 1

## 2021-04-27 MED ORDER — DEXAMETHASONE SODIUM PHOSPHATE 10 MG/ML IJ SOLN
INTRAMUSCULAR | Status: AC
  Filled 2021-04-27: qty 1

## 2021-04-27 MED ORDER — ACETAMINOPHEN 500 MG PO TABS
1000.0000 mg | ORAL_TABLET | Freq: Once | ORAL | Status: AC
  Administered 2021-04-27: 1000 mg via ORAL
  Filled 2021-04-27: qty 2

## 2021-04-27 MED ORDER — PHENYLEPHRINE 40 MCG/ML (10ML) SYRINGE FOR IV PUSH (FOR BLOOD PRESSURE SUPPORT)
PREFILLED_SYRINGE | INTRAVENOUS | Status: AC
  Filled 2021-04-27: qty 10

## 2021-04-27 MED ORDER — FENTANYL CITRATE (PF) 250 MCG/5ML IJ SOLN
INTRAMUSCULAR | Status: DC | PRN
  Administered 2021-04-27: 100 ug via INTRAVENOUS
  Administered 2021-04-27 (×2): 25 ug via INTRAVENOUS
  Administered 2021-04-27: 50 ug via INTRAVENOUS

## 2021-04-27 MED ORDER — ONDANSETRON HCL 4 MG/2ML IJ SOLN
INTRAMUSCULAR | Status: AC
  Filled 2021-04-27: qty 2

## 2021-04-27 MED ORDER — MENTHOL 3 MG MT LOZG: 1.0000 | LOZENGE | OROMUCOSAL | Status: DC | PRN

## 2021-04-27 MED ORDER — BUPIVACAINE HCL (PF) 0.25 % IJ SOLN
INTRAMUSCULAR | Status: AC
  Filled 2021-04-27: qty 30

## 2021-04-27 MED ORDER — LACTATED RINGERS IV SOLN: INTRAVENOUS | Status: DC

## 2021-04-27 MED ORDER — SODIUM CHLORIDE 0.9 % IV SOLN: INTRAVENOUS | Status: DC

## 2021-04-27 MED ORDER — MIDAZOLAM HCL 2 MG/2ML IJ SOLN
INTRAMUSCULAR | Status: AC
  Filled 2021-04-27: qty 2

## 2021-04-27 MED ORDER — ACETAMINOPHEN 325 MG PO TABS
650.0000 mg | ORAL_TABLET | ORAL | Status: DC | PRN
  Administered 2021-04-27 – 2021-04-28 (×3): 650 mg via ORAL
  Filled 2021-04-27 (×3): qty 2

## 2021-04-27 MED ORDER — INSULIN ASPART 100 UNIT/ML IJ SOLN: 20.0000 [IU] | Freq: Every day | INTRAMUSCULAR | Status: DC | PRN

## 2021-04-27 MED ORDER — FENTANYL CITRATE (PF) 250 MCG/5ML IJ SOLN
INTRAMUSCULAR | Status: AC
  Filled 2021-04-27: qty 5

## 2021-04-27 MED ORDER — ORAL CARE MOUTH RINSE: 15.0000 mL | Freq: Once | OROMUCOSAL | Status: AC

## 2021-04-27 MED ORDER — IPRATROPIUM-ALBUTEROL 0.5-2.5 (3) MG/3ML IN SOLN
RESPIRATORY_TRACT | Status: AC
  Filled 2021-04-27: qty 3

## 2021-04-27 MED ORDER — ONDANSETRON HCL 4 MG/2ML IJ SOLN: 4.0000 mg | Freq: Four times a day (QID) | INTRAMUSCULAR | Status: DC | PRN

## 2021-04-27 MED ORDER — ROCURONIUM BROMIDE 10 MG/ML (PF) SYRINGE
PREFILLED_SYRINGE | INTRAVENOUS | Status: DC | PRN
  Administered 2021-04-27: 15 mg via INTRAVENOUS
  Administered 2021-04-27: 60 mg via INTRAVENOUS

## 2021-04-27 MED ORDER — METHOCARBAMOL 500 MG PO TABS: 500.0000 mg | ORAL_TABLET | Freq: Four times a day (QID) | ORAL | 0 refills | Status: DC | PRN

## 2021-04-27 MED ORDER — ONDANSETRON HCL 4 MG/2ML IJ SOLN
INTRAMUSCULAR | Status: DC | PRN
  Administered 2021-04-27: 4 mg via INTRAVENOUS

## 2021-04-27 MED ORDER — MIDAZOLAM HCL 5 MG/5ML IJ SOLN
INTRAMUSCULAR | Status: DC | PRN
  Administered 2021-04-27: 2 mg via INTRAVENOUS

## 2021-04-27 MED ORDER — PROPOFOL 10 MG/ML IV BOLUS
INTRAVENOUS | Status: DC | PRN
  Administered 2021-04-27: 130 mg via INTRAVENOUS

## 2021-04-27 MED ORDER — ATORVASTATIN CALCIUM 10 MG PO TABS
10.0000 mg | ORAL_TABLET | Freq: Every day | ORAL | Status: DC
  Administered 2021-04-28: 10 mg via ORAL
  Filled 2021-04-27: qty 1

## 2021-04-27 MED ORDER — MIDAZOLAM HCL 2 MG/2ML IJ SOLN
1.0000 mg | Freq: Once | INTRAMUSCULAR | Status: AC
  Administered 2021-04-27: 1 mg via INTRAVENOUS

## 2021-04-27 MED ORDER — METHOCARBAMOL 1000 MG/10ML IJ SOLN
500.0000 mg | Freq: Four times a day (QID) | INTRAVENOUS | Status: DC | PRN
  Filled 2021-04-27: qty 5

## 2021-04-27 MED ORDER — ROCURONIUM BROMIDE 10 MG/ML (PF) SYRINGE
PREFILLED_SYRINGE | INTRAVENOUS | Status: AC
  Filled 2021-04-27: qty 10

## 2021-04-27 MED ORDER — LIDOCAINE 2% (20 MG/ML) 5 ML SYRINGE
INTRAMUSCULAR | Status: AC
  Filled 2021-04-27: qty 5

## 2021-04-27 MED ORDER — SODIUM CHLORIDE 0.9% FLUSH: 3.0000 mL | Freq: Two times a day (BID) | INTRAVENOUS | Status: DC

## 2021-04-27 MED ORDER — METHOCARBAMOL 500 MG PO TABS
500.0000 mg | ORAL_TABLET | Freq: Four times a day (QID) | ORAL | Status: DC | PRN
  Administered 2021-04-27 – 2021-04-28 (×3): 500 mg via ORAL
  Filled 2021-04-27 (×3): qty 1

## 2021-04-27 MED ORDER — BUPIVACAINE HCL (PF) 0.25 % IJ SOLN
INTRAMUSCULAR | Status: DC | PRN
  Administered 2021-04-27: 10 mL

## 2021-04-27 MED ORDER — CHLORHEXIDINE GLUCONATE 0.12 % MT SOLN
15.0000 mL | Freq: Once | OROMUCOSAL | Status: AC
  Administered 2021-04-27: 15 mL via OROMUCOSAL
  Filled 2021-04-27: qty 15

## 2021-04-27 MED ORDER — DOCUSATE SODIUM 100 MG PO CAPS
100.0000 mg | ORAL_CAPSULE | Freq: Two times a day (BID) | ORAL | Status: DC
  Administered 2021-04-27 – 2021-04-28 (×2): 100 mg via ORAL
  Filled 2021-04-27 (×2): qty 1

## 2021-04-27 MED ORDER — PHENOL 1.4 % MT LIQD: 1.0000 | OROMUCOSAL | Status: DC | PRN

## 2021-04-27 MED ORDER — GABAPENTIN 300 MG PO CAPS
300.0000 mg | ORAL_CAPSULE | Freq: Two times a day (BID) | ORAL | Status: DC
  Administered 2021-04-27 – 2021-04-28 (×2): 300 mg via ORAL
  Filled 2021-04-27 (×2): qty 1

## 2021-04-27 MED ORDER — IPRATROPIUM-ALBUTEROL 0.5-2.5 (3) MG/3ML IN SOLN
3.0000 mL | Freq: Once | RESPIRATORY_TRACT | Status: AC
  Administered 2021-04-27: 3 mL via RESPIRATORY_TRACT

## 2021-04-27 MED ORDER — EPHEDRINE 5 MG/ML INJ
INTRAVENOUS | Status: AC
  Filled 2021-04-27: qty 10

## 2021-04-27 MED ORDER — OXYCODONE-ACETAMINOPHEN 5-325 MG PO TABS: 1.0000 | ORAL_TABLET | ORAL | 0 refills | Status: DC | PRN

## 2021-04-27 MED ORDER — HYDROMORPHONE HCL 1 MG/ML IJ SOLN
0.5000 mg | INTRAMUSCULAR | Status: DC | PRN
  Filled 2021-04-27: qty 0.5

## 2021-04-27 MED ORDER — DEXAMETHASONE SODIUM PHOSPHATE 10 MG/ML IJ SOLN
INTRAMUSCULAR | Status: DC | PRN
  Administered 2021-04-27: 5 mg via INTRAVENOUS

## 2021-04-27 MED ORDER — HEMOSTATIC AGENTS (NO CHARGE) OPTIME
TOPICAL | Status: DC | PRN
  Administered 2021-04-27: 1 via TOPICAL

## 2021-04-27 MED ORDER — EPHEDRINE SULFATE 50 MG/ML IJ SOLN
INTRAMUSCULAR | Status: DC | PRN
  Administered 2021-04-27: 15 mg via INTRAVENOUS
  Administered 2021-04-27: 5 mg via INTRAVENOUS
  Administered 2021-04-27: 10 mg via INTRAVENOUS

## 2021-04-27 SURGICAL SUPPLY — 48 items
ADH SKN CLS APL DERMABOND .7 (GAUZE/BANDAGES/DRESSINGS) ×1
AGENT HMST KT MTR STRL THRMB (HEMOSTASIS) ×1
BUR ROUND FLUTED 4 SOFT TCH (BURR) IMPLANT
BUR ROUND FLUTED 4MM SOFT TCH (BURR)
CANISTER SUCT 3000ML PPV (MISCELLANEOUS) ×3 IMPLANT
COVER SURGICAL LIGHT HANDLE (MISCELLANEOUS) ×3 IMPLANT
COVER WAND RF STERILE (DRAPES) ×3 IMPLANT
DERMABOND ADVANCED (GAUZE/BANDAGES/DRESSINGS) ×2
DERMABOND ADVANCED .7 DNX12 (GAUZE/BANDAGES/DRESSINGS) ×1 IMPLANT
DRAPE MICROSCOPE LEICA (MISCELLANEOUS) ×3 IMPLANT
DRSG MEPILEX BORDER 4X4 (GAUZE/BANDAGES/DRESSINGS) IMPLANT
DRSG MEPILEX BORDER 4X8 (GAUZE/BANDAGES/DRESSINGS) ×2 IMPLANT
DRSG MEPITEL 4X7.2 (GAUZE/BANDAGES/DRESSINGS) ×2 IMPLANT
DURAPREP 26ML APPLICATOR (WOUND CARE) ×3 IMPLANT
DURASEAL SPINE SEALANT 3ML (MISCELLANEOUS) IMPLANT
ELECT BLADE 4.0 EZ CLEAN MEGAD (MISCELLANEOUS) ×3
ELECT REM PT RETURN 9FT ADLT (ELECTROSURGICAL) ×3
ELECTRODE BLDE 4.0 EZ CLN MEGD (MISCELLANEOUS) IMPLANT
ELECTRODE REM PT RTRN 9FT ADLT (ELECTROSURGICAL) ×1 IMPLANT
GAUZE SPONGE 4X4 12PLY STRL (GAUZE/BANDAGES/DRESSINGS) ×3 IMPLANT
GLOVE SRG 8 PF TXTR STRL LF DI (GLOVE) ×2 IMPLANT
GLOVE SURG ORTHO LTX SZ7.5 (GLOVE) ×6 IMPLANT
GLOVE SURG UNDER POLY LF SZ8 (GLOVE) ×6
GOWN STRL REUS W/ TWL LRG LVL3 (GOWN DISPOSABLE) ×1 IMPLANT
GOWN STRL REUS W/ TWL XL LVL3 (GOWN DISPOSABLE) ×1 IMPLANT
GOWN STRL REUS W/TWL 2XL LVL3 (GOWN DISPOSABLE) ×3 IMPLANT
GOWN STRL REUS W/TWL LRG LVL3 (GOWN DISPOSABLE) ×3
GOWN STRL REUS W/TWL XL LVL3 (GOWN DISPOSABLE) ×3
KIT BASIN OR (CUSTOM PROCEDURE TRAY) ×3 IMPLANT
KIT TURNOVER KIT B (KITS) ×3 IMPLANT
NDL SPNL 18GX3.5 QUINCKE PK (NEEDLE) ×1 IMPLANT
NEEDLE SPNL 18GX3.5 QUINCKE PK (NEEDLE) ×3 IMPLANT
NS IRRIG 1000ML POUR BTL (IV SOLUTION) ×3 IMPLANT
PACK LAMINECTOMY ORTHO (CUSTOM PROCEDURE TRAY) ×3 IMPLANT
PAD ARMBOARD 7.5X6 YLW CONV (MISCELLANEOUS) ×6 IMPLANT
PATTIES SURGICAL .5 X.5 (GAUZE/BANDAGES/DRESSINGS) IMPLANT
PATTIES SURGICAL .75X.75 (GAUZE/BANDAGES/DRESSINGS) IMPLANT
STAPLER VISISTAT 35W (STAPLE) ×2 IMPLANT
SURGIFLO W/THROMBIN 8M KIT (HEMOSTASIS) ×2 IMPLANT
SUT VIC AB 1 CTX 36 (SUTURE) ×3
SUT VIC AB 1 CTX36XBRD ANBCTR (SUTURE) ×1 IMPLANT
SUT VIC AB 2-0 CT1 27 (SUTURE) ×3
SUT VIC AB 2-0 CT1 TAPERPNT 27 (SUTURE) ×1 IMPLANT
SUT VIC AB 3-0 X1 27 (SUTURE) IMPLANT
SYR 20ML ECCENTRIC (SYRINGE) IMPLANT
TOWEL GREEN STERILE (TOWEL DISPOSABLE) ×3 IMPLANT
TOWEL GREEN STERILE FF (TOWEL DISPOSABLE) ×3 IMPLANT
WATER STERILE IRR 1000ML POUR (IV SOLUTION) ×3 IMPLANT

## 2021-04-27 NOTE — Anesthesia Postprocedure Evaluation (Signed)
Anesthesia Post Note  Patient: Allen Lowery  Procedure(s) Performed: LUMBAR TWO-THREE DECOMPRESSION, RIGHT LUMBAR TWO-THREE MICRODISCECTOMY (Spine Lumbar)     Patient location during evaluation: PACU Anesthesia Type: General Level of consciousness: awake and alert Pain management: pain level controlled Vital Signs Assessment: post-procedure vital signs reviewed and stable Respiratory status: spontaneous breathing, nonlabored ventilation and respiratory function stable Cardiovascular status: blood pressure returned to baseline and stable Postop Assessment: no apparent nausea or vomiting Anesthetic complications: no   No notable events documented.  Last Vitals:  Vitals:   04/27/21 1615 04/27/21 1638  BP: 113/72 133/75  Pulse: 84 83  Resp: 15 18  Temp: (!) 36.3 C (!) 36.4 C  SpO2: 99% 100%    Last Pain:  Vitals:   04/27/21 1638  TempSrc: Oral  PainSc:                  Allen Lowery,Allen Lowery

## 2021-04-27 NOTE — Interval H&P Note (Signed)
History and Physical Interval Note:  04/27/2021 12:11 PM  Allen Lowery  has presented today for surgery, with the diagnosis of L2-3 stenosis.  The various methods of treatment have been discussed with the patient and family. After consideration of risks, benefits and other options for treatment, the patient has consented to  Procedure(s): L2-3 DECOMPRESSION, RIGHT MICRODISCECTOMY (N/A) as a surgical intervention.  The patient's history has been reviewed, patient examined, no change in status, stable for surgery.  I have reviewed the patient's chart and labs.  Questions were answered to the patient's satisfaction.     Eldred Manges

## 2021-04-27 NOTE — Op Note (Signed)
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  Postop diagnosis: Same  Procedure: L2-3 central decompression and right L2-3 microdiscectomy for disc protrusion and lumbar spinal stenosis.  Surgeon: Ophelia Charter MD  Assistant: Zonia Kief, PA-C medically necessary and present for the entire procedure  Anesthesia: General plus Marcaine skin local  EBL 200 cc  Complications: None  Procedure: After induction of general anesthesia patient placed prone on chest rolls careful padding positioning calf pumpers 1010 drape at the S1 level warming blanket DuraPrep area squared with towel sterile skin marker Betadine Steri-Drape application and timeout procedure.  Needles were placed above and below palpable landmarks expecting this to be at the L2-3 level.  Patient had transitional anatomy and had an typically looking at plain x-rays this would be labeled L3-4 level however when the MRI scan was read there was the disc at S1-S2 and radiology labeled the level that had anterolisthesis and stenosis with large disc protrusion at the L2-3 level.  Once needles were placed images were confirmed needle was adjusted final spot picture was taken with plain radiograph and sent down for radiology read out.  We then made midline incision subperiosteal dissection placement of a McCullough retractor with wide deep blades.  Kocher clamps were placed and repeat image was taken crosstable lateral x-ray for decompression above and below confirmed with a second x-ray which was read by radiologist confirming where at the appropriate level where there was severe stenosis previously labeled by radiology who read his previous MRI at the L2-3 level.  Spinous process was removed lamina at L2 was thinned down bur was used thinning and top portion of L3 was taken performing bilateral laminotomy taking the top portion of the spinous process.   There was hypertrophic ligamentum which was removed.  Thick chunks of ligament on each side where there is lateral recess stenosis was decompressed.  Disc had significant bulge and annulus was incised on the right side passes were made with the regular pituitary micropituitary and moderate mount of disc was removed that was protruding centrally and site slightly cephalad retained underneath the ligament.  Once this was decompressed dural tube was round soft no areas compression and gutters were run right and left again to make sure there was no remaining chunks of ligament or bone spurs overhanging off the facet contributing to the spinal stenosis.  Nerve root below the disc level was followed out on each side removing some overhanging spurs so that the entry underneath the pedicle was open.  Copious irrigation some Surgi-Flo placed on the right side epidural veins were coagulated with the bipolar cautery epidural space was dry repeat irrigation and then standard layered closure with #1 Vicryl in the fascia 2-0 Vicryl's and subtendinous tissue skin splints closure postop dressing and transfer to the recovery room after soft dressing.  Patient tolerated procedure well.  Instrument count ,needle count was correct.

## 2021-04-27 NOTE — Anesthesia Procedure Notes (Signed)
Procedure Name: Intubation Date/Time: 04/27/2021 12:54 PM Performed by: Elliot Dally, CRNA Pre-anesthesia Checklist: Patient identified, Emergency Drugs available, Suction available and Patient being monitored Patient Re-evaluated:Patient Re-evaluated prior to induction Oxygen Delivery Method: Circle System Utilized Preoxygenation: Pre-oxygenation with 100% oxygen Induction Type: IV induction Ventilation: Mask ventilation without difficulty Tube type: Oral Tube size: 7.5 mm Number of attempts: 1 Airway Equipment and Method: Stylet and Oral airway Placement Confirmation: ETT inserted through vocal cords under direct vision, positive ETCO2 and breath sounds checked- equal and bilateral Secured at: 22 cm Tube secured with: Tape Dental Injury: Teeth and Oropharynx as per pre-operative assessment

## 2021-04-27 NOTE — Anesthesia Preprocedure Evaluation (Addendum)
Anesthesia Evaluation  Patient identified by MRN, date of birth, ID band Patient awake    Reviewed: Allergy & Precautions, H&P , NPO status , Patient's Chart, lab work & pertinent test results  Airway Mallampati: III  TM Distance: >3 FB Neck ROM: Full    Dental no notable dental hx. (+) Teeth Intact, Dental Advisory Given   Pulmonary neg pulmonary ROS,    Pulmonary exam normal breath sounds clear to auscultation       Cardiovascular negative cardio ROS   Rhythm:Regular Rate:Normal     Neuro/Psych negative neurological ROS  negative psych ROS   GI/Hepatic negative GI ROS, Neg liver ROS,   Endo/Other  diabetes, Oral Hypoglycemic Agents  Renal/GU negative Renal ROS  negative genitourinary   Musculoskeletal   Abdominal   Peds  Hematology negative hematology ROS (+)   Anesthesia Other Findings   Reproductive/Obstetrics negative OB ROS                            Anesthesia Physical Anesthesia Plan  ASA: 2  Anesthesia Plan: General   Post-op Pain Management:    Induction: Intravenous  PONV Risk Score and Plan: 3 and Ondansetron, Dexamethasone and Midazolam  Airway Management Planned: Oral ETT  Additional Equipment:   Intra-op Plan:   Post-operative Plan: Extubation in OR  Informed Consent: I have reviewed the patients History and Physical, chart, labs and discussed the procedure including the risks, benefits and alternatives for the proposed anesthesia with the patient or authorized representative who has indicated his/her understanding and acceptance.     Dental advisory given and Interpreter used for interveiw  Plan Discussed with: CRNA  Anesthesia Plan Comments:        Anesthesia Quick Evaluation

## 2021-04-27 NOTE — H&P (Signed)
Allen Lowery is an 60 y.o. male.   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  He is accompanied by interpreter.  Patient states that back pain and lower extremity radiculopathy unchanged from previous visit.  He is wanting to proceed with L2-3 decompression and microdiscectomy as scheduled.  Today history and physical performed.  Review of systems negative.  Surgical procedure discussed in detail along with potential hospital stay  Past Medical History:  Diagnosis Date   Diabetes mellitus without complication (HCC)    History of kidney stones     Past Surgical History:  Procedure Laterality Date   KIDNEY STONE SURGERY     kidney stones     TOOTH EXTRACTION      Family History  Problem Relation Age of Onset   Diabetes Mother    Heart disease Neg Hx    Hypertension Neg Hx    Cancer Neg Hx    Social History:  reports that he has never smoked. He has never used smokeless tobacco. He reports that he does not drink alcohol and does not use drugs.  Allergies:  Allergies  Allergen Reactions   Aspirin     Gi upset    No medications prior to admission.    No results found for this or any previous visit (from the past 48 hour(s)). No results found.  Review of Systems  Constitutional:  Positive for activity change.  HENT: Negative.    Eyes: Negative.   Respiratory: Negative.    Cardiovascular: Negative.   Gastrointestinal: Negative.   Genitourinary: Negative.   Musculoskeletal:  Positive for back pain and gait problem.  Neurological:  Positive for numbness.  Psychiatric/Behavioral: Negative.     There were no vitals taken for this visit. Physical Exam HENT:     Head: Normocephalic.     Nose: Nose normal.  Eyes:     Extraocular Movements: Extraocular movements intact.  Cardiovascular:     Rate and Rhythm: Regular rhythm.  Pulmonary:     Effort: Pulmonary  effort is normal. No respiratory distress.     Breath sounds: Normal breath sounds.  Musculoskeletal:        General: Tenderness present.  Neurological:     Mental Status: He is alert and oriented to person, place, and time.  Psychiatric:        Mood and Affect: Mood normal.     Assessment/Plan L2-3 HNP/stenosis  Will proceed with surgery as scheduled.  Surgical procedure discussed.  All questions answered with assistance of interpreter.    Zonia Kief, PA-C 04/27/2021, 9:59 AM

## 2021-04-27 NOTE — Transfer of Care (Signed)
Immediate Anesthesia Transfer of Care Note  Patient: Davonna Belling  Procedure(s) Performed: LUMBAR TWO-THREE DECOMPRESSION, RIGHT LUMBAR TWO-THREE MICRODISCECTOMY (Spine Lumbar)  Patient Location: PACU  Anesthesia Type:General  Level of Consciousness: drowsy  Airway & Oxygen Therapy: Patient Spontanous Breathing and Patient connected to nasal cannula oxygen  Post-op Assessment: Report given to RN and Post -op Vital signs reviewed and stable  Post vital signs: Reviewed and stable  Last Vitals:  Vitals Value Taken Time  BP 111/69 04/27/21 1446  Temp 36.2 C 04/27/21 1445  Pulse 72 04/27/21 1451  Resp 14 04/27/21 1451  SpO2 100 % 04/27/21 1451  Vitals shown include unvalidated device data.  Last Pain:  Vitals:   04/27/21 1445  TempSrc:   PainSc: Asleep      Patients Stated Pain Goal: 4 (04/27/21 1054)  Complications: No notable events documented.

## 2021-04-27 NOTE — Discharge Instructions (Addendum)
  Ok to shower 1 days postop.  Do not apply any creams or ointments to incision.  Do not remove steri-strips.  Can use 4x4 gauze and tape for dressing change if your dressing comes off.  No aggressive activity.  No lifting,bending, twisting, squatting or prolonged sitting.  Mostly be in reclined position or lying down.    No driving  Call office immediately if you have any questions or concerns.    Call Your Doctor If Any of These Occur Redness, drainage, or swelling at the wound.  Temperature greater than 101 degrees. Severe pain not relieved by pain medication. Incision starts to come apart. Marland Kitchen

## 2021-04-28 ENCOUNTER — Encounter (HOSPITAL_COMMUNITY): Payer: Self-pay | Admitting: Orthopaedic Surgery

## 2021-04-28 LAB — GLUCOSE, CAPILLARY
Glucose-Capillary: 105 mg/dL — ABNORMAL HIGH (ref 70–99)
Glucose-Capillary: 102 mg/dL — ABNORMAL HIGH (ref 70–99)

## 2021-04-28 NOTE — Progress Notes (Signed)
   Subjective: 1 Day Post-Op Procedure(s) (LRB): LUMBAR TWO-THREE DECOMPRESSION, RIGHT LUMBAR TWO-THREE MICRODISCECTOMY (N/A) Patient reports pain as mild.    Objective: Vital signs in last 24 hours: Temp:  [97.2 F (36.2 C)-99.1 F (37.3 C)] 97.9 F (36.6 C) (06/28 0723) Pulse Rate:  [74-94] 75 (06/28 0723) Resp:  [10-20] 18 (06/28 0723) BP: (104-133)/(55-93) 127/80 (06/28 0723) SpO2:  [97 %-100 %] 100 % (06/28 0723) Weight:  [93.9 kg] 93.9 kg (06/27 1046)  Intake/Output from previous day: 06/27 0701 - 06/28 0700 In: -  Out: 200 [Blood:200] Intake/Output this shift: No intake/output data recorded.  No results for input(s): HGB in the last 72 hours. No results for input(s): WBC, RBC, HCT, PLT in the last 72 hours. No results for input(s): NA, K, CL, CO2, BUN, CREATININE, GLUCOSE, CALCIUM in the last 72 hours. No results for input(s): LABPT, INR in the last 72 hours.  Neurologically intact DG Lumbar Spine 1 View  Result Date: 04/27/2021 CLINICAL DATA:  Intraop localization. EXAM: LUMBAR SPINE - 1 VIEW COMPARISON:  MRI April 06, 2021. FINDINGS: Radiopaque localizers directed at the posterior elements of 2 adjacent lumbar spine levels. Levels are labeled on the lateral view as labeled on the previous MRI for consistency. A subsequent radiograph was obtained as well, please refer to that report for further detail. The first radiopaque more cephalad marker is directed at posterior elements of L2 the second radiopaque marker at posterior elements of L3 bracketing the L2-L3 disc space as indicated by the previous MRI. IMPRESSION: 1. Single lateral view for localization. Subsequent images were obtained and were discussed with the surgeon performing the procedure as outlined in the dictation for that study. Electronically Signed   By: Donzetta Kohut M.D.   On: 04/27/2021 13:46   DG Lumbar Spine 1 View  Result Date: 04/27/2021 CLINICAL DATA:  Surgical localization. EXAM: LUMBAR SPINE - 1  VIEW COMPARISON:  MRI of April 06, 2021. FINDINGS: Single intraoperative cross-table lateral projection was obtained of the lumbar spine. Labeling nomenclature utilized is the same as noted on MRI of April 06, 2021. There are surgical probes directed toward posterior elements of L2 and L3 vertebral bodies. Surgical retractor is seen at the L2-3 level. IMPRESSION: Surgical localization as described above. These results were called by telephone at the time of interpretation on 04/27/2021 at 1:38 pm to provider Tsuruko Murtha , who verbally acknowledged these results. Electronically Signed   By: Lupita Raider M.D.   On: 04/27/2021 13:39    Assessment/Plan: 1 Day Post-Op Procedure(s) (LRB): LUMBAR TWO-THREE DECOMPRESSION, RIGHT LUMBAR TWO-THREE MICRODISCECTOMY (N/A) Plan: discharge home this AM. Dressing change. Office one week.    Eldred Manges 04/28/2021, 7:43 AM

## 2021-04-28 NOTE — Progress Notes (Signed)
Patient is discharged from room 3C05 at this time. Alert and in stable condition. IV site d/c'd and instructions read to patient with understanding verbalized and all questions answered. Left unit via wheelchair with all belongings at side.  

## 2021-04-28 NOTE — Evaluation (Signed)
Physical Therapy Evaluation Patient Details Name: Allen Lowery MRN: [id number] DOB: February 22, 1961 Today's Date: 04/28/2021   History of Present Illness  Pt is a 60 y/o male who presents s/p L2-3 decompression and microdiscectomy on 6/27. PMH including DM and kidney stones.  Clinical Impression  Pt admitted with above diagnosis. At the time of PT eval, pt was able to demonstrate transfers and ambulation with gross modified independence to supervision for safety and no AD. Pt was educated on precautions, positioning recommendations, appropriate activity progression, and car transfer. Pt currently with functional limitations due to the deficits listed below (see PT Problem List). Pt will benefit from skilled PT to increase their independence and safety with mobility to allow discharge to the venue listed below.      Follow Up Recommendations No PT follow up;Supervision for mobility/OOB    Equipment Recommendations  None recommended by PT    Recommendations for Other Services       Precautions / Restrictions Precautions Precautions: Back Precaution Booklet Issued: Yes (comment) Precaution Comments: Reviewed the precautions and compensatory techniques Required Braces or Orthoses: Other Brace Other Brace: No brace per MD order Restrictions Weight Bearing Restrictions: No      Mobility  Bed Mobility Overal bed mobility: Needs Assistance Bed Mobility: Rolling;Sit to Sidelying;Sidelying to Sit Rolling: Modified independent (Device/Increase time) Sidelying to sit: Supervision     Sit to sidelying: Supervision General bed mobility comments: Supervision to ensure maintenance of precautions. No assist required however VC's provided throughout. HOB flat and rails lowered to simulate home environment.    Transfers Overall transfer level: Modified independent Equipment used: None             General transfer comment: VC's for improved posture as pt powered up to full stand.  No assist required.  Ambulation/Gait Ambulation/Gait assistance: Modified independent (Device/Increase time) Gait Distance (Feet): 300 Feet Assistive device: None Gait Pattern/deviations: Step-through pattern;Decreased stride length;Trunk flexed Gait velocity: Decreased Gait velocity interpretation: <1.31 ft/sec, indicative of household ambulator General Gait Details: VC's for improved posture and general safety awareness. No overt LOB noted.  Stairs Stairs: Yes Stairs assistance: Min guard Stair Management: One rail Right;Step to pattern;Forwards Number of Stairs: 4 General stair comments: VC's for sequencing and safety. Pt planning to utilize ramp however practiced stairs just to get the feel for it in case he had to do stairs at d/c at some point.  Wheelchair Mobility    Modified Rankin (Stroke Patients Only)       Balance Overall balance assessment: No apparent balance deficits (not formally assessed)                                           Pertinent Vitals/Pain Pain Assessment: Faces Faces Pain Scale: Hurts little more Pain Location: Back Pain Descriptors / Indicators: Discomfort;Grimacing Pain Intervention(s): Limited activity within patient's tolerance;Monitored during session;Repositioned    Home Living Family/patient expects to be discharged to:: Private residence Living Arrangements: Children;Spouse/significant other Available Help at Discharge: Family;Available 24 hours/day Type of Home: House Home Access: Ramped entrance;Stairs to enter Entrance Stairs-Rails: None Entrance Stairs-Number of Steps: 4 Home Layout: One level Home Equipment: Walker - standard      Prior Function Level of Independence: Independent               Hand Dominance        Extremity/Trunk Assessment   Upper  Extremity Assessment Upper Extremity Assessment: Overall WFL for tasks assessed    Lower Extremity Assessment Lower Extremity Assessment:  Generalized weakness (Consistent with pre-op diagnosis)    Cervical / Trunk Assessment Cervical / Trunk Assessment: Other exceptions Cervical / Trunk Exceptions: s/p surgery  Communication   Communication: Prefers language other than [place] (In-person spanish interpreter utilized)  Cognition Arousal/Alertness: Awake/alert Behavior During Therapy: WFL for tasks assessed/performed Overall Cognitive Status: Within Functional Limits for tasks assessed                                        General Comments      Exercises     Assessment/Plan    PT Assessment Patient needs continued PT services  PT Problem List Decreased strength;Decreased activity tolerance;Decreased balance;Decreased mobility;Decreased knowledge of use of DME;Decreased safety awareness;Decreased knowledge of precautions;Pain       PT Treatment Interventions DME instruction;Stair training;Gait training;Functional mobility training;Therapeutic exercise;Therapeutic activities;Neuromuscular re-education;Patient/family education    PT Goals (Current goals can be found in the Care Plan section)  Acute Rehab PT Goals Patient Stated Goal: Go home PT Goal Formulation: With patient Time For Goal Achievement: 05/05/21 Potential to Achieve Goals: Good    Frequency Min 5X/week   Barriers to discharge        Co-evaluation               AM-PAC PT "6 Clicks" Mobility  Outcome Measure Help needed turning from your back to your side while in a flat bed without using bedrails?: None Help needed moving from lying on your back to sitting on the side of a flat bed without using bedrails?: A Little Help needed moving to and from a bed to a chair (including a wheelchair)?: A Little Help needed standing up from a chair using your arms (e.g., wheelchair or bedside chair)?: None Help needed to walk in hospital room?: A Little Help needed climbing 3-5 steps with a railing? : A Little 6 Click Score: 20     End of Session Equipment Utilized During Treatment: Gait belt Activity Tolerance: Patient tolerated treatment well Patient left: with call bell/phone within reach;Other (comment) (Sitting EOB with Interpreter present - awaiting OT) Nurse Communication: Mobility status PT Visit Diagnosis: Unsteadiness on feet (R26.81);Pain Pain - part of body:  (back, LLE (buttock/hip))    Time: 8657-8469 PT Time Calculation (min) (ACUTE ONLY): 21 min   Charges:   PT Evaluation $PT Eval Low Complexity: 1 Low          Conni Slipper, PT, DPT Acute Rehabilitation Services Pager: [phone] Office: [phone]   Marylynn Pearson 04/28/2021, 10:32 AM

## 2021-04-28 NOTE — Social Work (Signed)
Csw gave pt taxi voucher for home.   Jimmy Picket, Theresia Majors, [place] Clinical Social Worker [phone]

## 2021-04-28 NOTE — Evaluation (Signed)
Occupational Therapy Evaluation Patient Details Name: Allen Lowery MRN: [id number] DOB: 1961-03-12 Today's Date: 04/28/2021    History of Present Illness 60 yo male s/p L2-3 decompression and microdiscectomy on 6/27. PMH including DM and kidney stones.   Clinical Impression   PTA, pt was living with his wife and was independent. Currently, pt performing ADLs and functional mobility at Mod I level. Provided education and handout on back precautions, bed mobility, grooming, UB ADLs, LB ADLs, toileting, and tub transfer; pt demonstrated understanding. Answered all pt questions. Recommend dc home once medically stable per physician. All acute OT needs met and will sign off. Thank you.    Follow Up Recommendations  No OT follow up    Equipment Recommendations  None recommended by OT    Recommendations for Other Services PT consult     Precautions / Restrictions Precautions Precautions: Back Precaution Booklet Issued: Yes (comment) Precaution Comments: Reviewed the precautions and compensatory techniques Required Braces or Orthoses: Other Brace Other Brace: No brace per MD order      Mobility Bed Mobility Overal bed mobility: Needs Assistance Bed Mobility: Rolling;Sit to Sidelying Rolling: Supervision       Sit to sidelying: Supervision General bed mobility comments: Supervision for safety    Transfers Overall transfer level: Modified independent                    Balance Overall balance assessment: No apparent balance deficits (not formally assessed)                                         ADL either performed or assessed with clinical judgement   ADL Overall ADL's : Modified independent                                       General ADL Comments: Pt performing ADLs and functional mobility at Mod I level with increased time. Providing education on compensatory techniques for ADLs including bed mobility, grooming, UB  ADLs, LB ADLs toileting, and tub transfer     Vision Baseline Vision/History: Wears glasses Wears Glasses: At all times Patient Visual Report: No change from baseline       Perception     Praxis      Pertinent Vitals/Pain Pain Assessment: Faces Faces Pain Scale: Hurts little more Pain Location: Back Pain Descriptors / Indicators: Discomfort;Grimacing Pain Intervention(s): Monitored during session;Repositioned     Hand Dominance     Extremity/Trunk Assessment Upper Extremity Assessment Upper Extremity Assessment: Overall WFL for tasks assessed   Lower Extremity Assessment Lower Extremity Assessment: Defer to PT evaluation   Cervical / Trunk Assessment Cervical / Trunk Assessment: Other exceptions Cervical / Trunk Exceptions: s/p back sx   Communication Communication Communication: Prefers language other than [place] (In-person spanish interpreter utilized)   Cognition Arousal/Alertness: Awake/alert Behavior During Therapy: WFL for tasks assessed/performed Overall Cognitive Status: Within Functional Limits for tasks assessed                                     General Comments       Exercises     Shoulder Instructions      Home Living Family/patient expects to be discharged to:: Private residence Living  Arrangements: Children;Spouse/significant other Available Help at Discharge: Family;Available 24 hours/day Type of Home: House Home Access: Ramped entrance     Home Layout: One level     Bathroom Shower/Tub: Teacher, early years/pre: Standard     Home Equipment: Walker - standard          Prior Functioning/Environment Level of Independence: Independent                 OT Problem List: Decreased strength;Decreased activity tolerance;Decreased range of motion;Impaired balance (sitting and/or standing);Decreased knowledge of precautions;Decreased knowledge of use of DME or AE      OT Treatment/Interventions:       OT Goals(Current goals can be found in the care plan section) Acute Rehab OT Goals Patient Stated Goal: Go home OT Goal Formulation: All assessment and education complete, DC therapy  OT Frequency:     Barriers to D/C:            Co-evaluation              AM-PAC OT "6 Clicks" Daily Activity     Outcome Measure Help from another person eating meals?: None Help from another person taking care of personal grooming?: None Help from another person toileting, which includes using toliet, bedpan, or urinal?: None Help from another person bathing (including washing, rinsing, drying)?: None Help from another person to put on and taking off regular upper body clothing?: None Help from another person to put on and taking off regular lower body clothing?: None 6 Click Score: 24   End of Session Equipment Utilized During Treatment: Gait belt Nurse Communication: Mobility status  Activity Tolerance: Patient tolerated treatment well Patient left: in bed;with call bell/phone within reach  OT Visit Diagnosis: Unsteadiness on feet (R26.81);Other abnormalities of gait and mobility (R26.89);Muscle weakness (generalized) (M62.81)                Time: 4037-5436 OT Time Calculation (min): 15 min Charges:  OT General Charges $OT Visit: 1 Visit OT Evaluation $OT Eval Low Complexity: 1 Low  Devon Pretty MSOT, OTR/L Acute Rehab Pager: [phone] Office: [place] 04/28/2021, 9:42 AM

## 2021-04-29 ENCOUNTER — Telehealth: Payer: Self-pay

## 2021-04-29 LAB — HEMOGLOBIN A1C
Hgb A1c MFr Bld: 7 % — ABNORMAL HIGH (ref 4.8–5.6)
Mean Plasma Glucose: 154 mg/dL

## 2021-04-29 NOTE — Telephone Encounter (Signed)
Transition Care Management Follow-up Telephone Call  Call placed with assistance of Spanish interpreter #[phone]/[employer] Date of discharge and from where: 04/27/2021, [facility]  How have you been since you were released from the hospital? He said he is feeling much better every day.  Any questions or concerns? Yes - he wanted to confirm that he is not to shower for 5 days after surgery. This is noted on the AVS.  Items Reviewed: Did the pt receive and understand the discharge instructions provided? Yes  Medications obtained and verified? Yes - he said he has all medications and does not have any questions about his med regime.  Other? No  Any new allergies since your discharge? No  Do you have support at home? Yes   Home Care and Equipment/Supplies: Were home health services ordered? no If so, what is the name of the agency? N/a  Has the agency set up a time to come to the patient's home? not applicable Were any new equipment or medical supplies ordered?  No What is the name of the medical supply agency? N/a Were you able to get the supplies/equipment? not applicable Do you have any questions related to the use of the equipment or supplies? No  Has glucometer  Functional Questionnaire: (I = Independent and D = Dependent) ADLs: independent with personal care. Has cane to use if needed.     Follow up appointments reviewed:  PCP Hospital f/u appt confirmed? Yes  Scheduled to see Dr Laural Benes  on 06/30/2021 @ 0910. Specialist Hospital f/u appt confirmed? Yes  Scheduled to see orthopedic surgeon  on 05/06/2021 Are transportation arrangements needed? No  If their condition worsens, is the pt aware to call PCP or go to the Emergency Dept.? Yes Was the patient provided with contact information for the PCP's office or ED? Yes Was to pt encouraged to call back with questions or concerns? Yes

## 2021-05-06 ENCOUNTER — Ambulatory Visit (INDEPENDENT_AMBULATORY_CARE_PROVIDER_SITE_OTHER): Payer: Self-pay | Admitting: Orthopaedic Surgery

## 2021-05-06 ENCOUNTER — Encounter: Payer: Self-pay | Admitting: Orthopaedic Surgery

## 2021-05-06 DIAGNOSIS — Z9889 Other specified postprocedural states: Secondary | ICD-10-CM

## 2021-05-06 MED ORDER — OXYCODONE-ACETAMINOPHEN 5-325 MG PO TABS: 1.0000 | ORAL_TABLET | Freq: Four times a day (QID) | ORAL | 0 refills | Status: DC | PRN

## 2021-05-06 NOTE — Progress Notes (Signed)
   Post-Op Visit Note   Patient: Allen Lowery           Date of Birth: [birth year]           MRN: [id number] Visit Date: 05/06/2021 PCP: Marcine Matar, MD  Postop L2-3 decompression with right microdiscectomy.  Staples removed Steri-Strips applied.  Continue walking avoid sitting bending lifting.  Recheck 4 weeks. Assessment & Plan:ROV 4 wks  Chief Complaint:  Chief Complaint  Patient presents with   Lower Back - Routine Post Op    04/27/2021 L2-3 decompression, Right L2-3 microdiscectomy   Visit Diagnoses:  1. History of lumbar laminectomy for spinal cord decompression     Plan: Staples harvested return 4 weeks pain medication 20 tablets sent in.  He will wean down with pain medication he can use some ibuprofen.  Return 4 weeks.  Follow-Up Instructions: No follow-ups on file.   Orders:  No orders of the defined types were placed in this encounter.  Meds ordered this encounter  Medications   oxyCODONE-acetaminophen (PERCOCET/ROXICET) 5-325 MG tablet    Sig: Take 1 tablet by mouth every 6 (six) hours as needed for severe pain.    Dispense:  20 tablet    Refill:  0    Imaging: No results found.  PMFS History: Patient Active Problem List   Diagnosis Date Noted   History of lumbar laminectomy for spinal cord decompression 05/06/2021   Protrusion of lumbar intervertebral disc 04/15/2021   Spinal stenosis of lumbar region 04/15/2021   Health care maintenance 05/01/2020   T2DM (type 2 diabetes mellitus) (HCC) 04/01/2020   Blurred vision, right eye 04/01/2020   Ear abrasion 04/01/2020   Bell's palsy 03/15/2020   Past Medical History:  Diagnosis Date   Diabetes mellitus without complication (HCC)    History of kidney stones     Family History  Problem Relation Age of Onset   Diabetes Mother    Heart disease Neg Hx    Hypertension Neg Hx    Cancer Neg Hx     Past Surgical History:  Procedure Laterality Date   KIDNEY STONE SURGERY     kidney stones      LUMBAR LAMINECTOMY N/A 04/27/2021   Procedure: LUMBAR TWO-THREE DECOMPRESSION, RIGHT LUMBAR TWO-THREE MICRODISCECTOMY;  Surgeon: Eldred Manges, MD;  Location: [facility] OR;  Service: Orthopedics;  Laterality: N/A;   TOOTH EXTRACTION     Social History   Occupational History   Not on file  Tobacco Use   Smoking status: Never   Smokeless tobacco: Never  Vaping Use   Vaping Use: Never used  Substance and Sexual Activity   Alcohol use: No   Drug use: No   Sexual activity: Not on file

## 2021-05-12 NOTE — Discharge Summary (Addendum)
Patient ID: Allen Lowery MRN: [id number] DOB/AGE: 59-Sep-1962 59 y.o.  Admit date: 04/27/2021 Discharge date: 04/28/2021  Admission Diagnoses:  L2-3 HNP/stenosis   Discharge Diagnoses:    status post Procedure(s): LUMBAR TWO-THREE DECOMPRESSION, RIGHT LUMBAR TWO-THREE MICRODISCECTOMY  Past Medical History:  Diagnosis Date   Diabetes mellitus without complication (HCC)    History of kidney stones     Surgeries: Procedure(s): LUMBAR TWO-THREE DECOMPRESSION, RIGHT LUMBAR TWO-THREE MICRODISCECTOMY on 04/27/2021   Consultants:   Discharged Condition: Improved  Hospital Course: Allen Lowery is an 60 y.o. male who was admitted 04/27/2021 for operative treatment of L2-3 HNP. Patient failed conservative treatments (please see the history and physical for the specifics) and had severe unremitting pain that affects sleep, daily activities and work/hobbies. After pre-op clearance, the patient was taken to the operating room on 04/27/2021 and underwent  Procedure(s): LUMBAR TWO-THREE DECOMPRESSION, RIGHT LUMBAR TWO-THREE MICRODISCECTOMY.    Patient was given perioperative antibiotics:  Anti-infectives (From admission, onward)    Start     Dose/Rate Route Frequency Ordered Stop   04/27/21 1045  ceFAZolin (ANCEF) IVPB 2g/100 mL premix        2 g 200 mL/hr over 30 Minutes Intravenous On call to O.R. 04/27/21 1032 04/27/21 1257        Patient was given sequential compression devices and early ambulation to prevent DVT.   Patient benefited maximally from hospital stay and there were no complications. At the time of discharge, the patient was urinating/moving their bowels without difficulty, tolerating a regular diet, pain is controlled with oral pain medications and they have been cleared by PT/OT.   Recent vital signs: No data found.   Recent laboratory studies: No results for input(s): WBC, HGB, HCT, PLT, NA, K, CL, CO2, BUN, CREATININE, GLUCOSE, INR, CALCIUM in the  last 72 hours.  Invalid input(s): PT, 2   Discharge Medications:   Allergies as of 04/28/2021       Reactions   Aspirin    Gi upset        Medication List     STOP taking these medications    ibuprofen 800 MG tablet Commonly known as: ADVIL       TAKE these medications    atorvastatin 10 MG tablet Commonly known as: LIPITOR Tome 1 tableta (10 mg en total) por va oral diariamente. (TAKE 1 TABLET (10 MG TOTAL) BY MOUTH DAILY.) What changed: how much to take   gabapentin 300 MG capsule Commonly known as: NEURONTIN TAKE 1 CAPSULE BY MOUTH EVERY NIGHT AT BEDTIME FOR 1 WEEK THEN TAKE 1 CAPSULE BY MOUTH 2 TIMES DAILY What changed:  how much to take how to take this when to take this   insulin aspart 100 UNIT/ML injection Commonly known as: novoLOG Inject 20 Units into the skin daily as needed (blood sugar above 145).   metFORMIN 500 MG 24 hr tablet Commonly known as: Glucophage XR Take 1 tablet (500 mg total) by mouth 2 times daily at 12 noon and 4 pm.   methocarbamol 500 MG tablet Commonly known as: Robaxin Take 1 tablet (500 mg total) by mouth every 6 (six) hours as needed for muscle spasms. What changed: when to take this   oxyCODONE-acetaminophen 5-325 MG tablet Commonly known as: PERCOCET/ROXICET Take 1 tablet by mouth every 4 (four) hours as needed for severe pain.        Diagnostic Studies: DG Lumbar Spine 1 View  Result Date: 04/27/2021 CLINICAL DATA:  Intraop localization. EXAM:  LUMBAR SPINE - 1 VIEW COMPARISON:  MRI April 06, 2021. FINDINGS: Radiopaque localizers directed at the posterior elements of 2 adjacent lumbar spine levels. Levels are labeled on the lateral view as labeled on the previous MRI for consistency. A subsequent radiograph was obtained as well, please refer to that report for further detail. The first radiopaque more cephalad marker is directed at posterior elements of L2 the second radiopaque marker at posterior elements of L3  bracketing the L2-L3 disc space as indicated by the previous MRI. IMPRESSION: 1. Single lateral view for localization. Subsequent images were obtained and were discussed with the surgeon performing the procedure as outlined in the dictation for that study. Electronically Signed   By: Donzetta Kohut M.D.   On: 04/27/2021 13:46   DG Lumbar Spine 1 View  Result Date: 04/27/2021 CLINICAL DATA:  Surgical localization. EXAM: LUMBAR SPINE - 1 VIEW COMPARISON:  MRI of April 06, 2021. FINDINGS: Single intraoperative cross-table lateral projection was obtained of the lumbar spine. Labeling nomenclature utilized is the same as noted on MRI of April 06, 2021. There are surgical probes directed toward posterior elements of L2 and L3 vertebral bodies. Surgical retractor is seen at the L2-3 level. IMPRESSION: Surgical localization as described above. These results were called by telephone at the time of interpretation on 04/27/2021 at 1:38 pm to provider MARK YATES , who verbally acknowledged these results. Electronically Signed   By: Lupita Raider M.D.   On: 04/27/2021 13:39    Discharge Instructions     Incentive spirometry RT   Complete by: As directed         Follow-up Information     Eldred Manges, MD. Schedule an appointment as soon as possible for a visit in 1 week(s).   Specialty: Orthopedic Surgery Why: need return office visit one week postop.  call to schedule appointment Contact information: [address] [place] [address] [phone]                 Discharge Plan:  discharge to home  Disposition:     Signed: Zonia Kief  05/12/2021, 4:02 PM

## 2021-06-01 ENCOUNTER — Encounter: Payer: Self-pay | Admitting: Registered"

## 2021-06-09 ENCOUNTER — Ambulatory Visit (INDEPENDENT_AMBULATORY_CARE_PROVIDER_SITE_OTHER): Payer: Self-pay | Admitting: Orthopaedic Surgery

## 2021-06-09 ENCOUNTER — Encounter: Payer: Self-pay | Admitting: Orthopaedic Surgery

## 2021-06-09 ENCOUNTER — Other Ambulatory Visit: Payer: Self-pay

## 2021-06-09 VITALS — BP 138/83 | HR 69 | Ht 66.0 in | Wt 207.0 lb

## 2021-06-09 DIAGNOSIS — Z9889 Other specified postprocedural states: Secondary | ICD-10-CM

## 2021-06-09 NOTE — Progress Notes (Signed)
   Post-Op Visit Note   Patient: Allen Lowery           Date of Birth: 1961-09-03           MRN: [id number] Visit Date: 06/09/2021 PCP: Marcine Matar, MD   Assessment & Plan: Follow-up L2-3 decompression right L2-3 microdiscectomy.  Preop walking only 100 feet now he can walk 30 minutes.  He states he starts getting some cramping in his legs.  He is not on any pain medication.  4 weeks ago he states when he went to the pharmacy he could not get the Percocet renewed.  He states he is not taking any medications currently.  Patient does have diabetes and we discussed gradually working on some weight loss to help with his diabetes and blood pressure.  He is happy with the results of surgery.  Chief Complaint:  Chief Complaint  Patient presents with   Lower Back - Follow-up   Visit Diagnoses:  1. History of lumbar laminectomy for spinal cord decompression     Plan: return PRN.   Follow-Up Instructions: No follow-ups on file.   Orders:  No orders of the defined types were placed in this encounter.  No orders of the defined types were placed in this encounter.   Imaging: No results found.  PMFS History: Patient Active Problem List   Diagnosis Date Noted   History of lumbar laminectomy for spinal cord decompression 05/06/2021   Health care maintenance 05/01/2020   T2DM (type 2 diabetes mellitus) (HCC) 04/01/2020   Blurred vision, right eye 04/01/2020   Ear abrasion 04/01/2020   Bell's palsy 03/15/2020   Past Medical History:  Diagnosis Date   Diabetes mellitus without complication (HCC)    History of kidney stones     Family History  Problem Relation Age of Onset   Diabetes Mother    Heart disease Neg Hx    Hypertension Neg Hx    Cancer Neg Hx     Past Surgical History:  Procedure Laterality Date   KIDNEY STONE SURGERY     kidney stones     LUMBAR LAMINECTOMY N/A 04/27/2021   Procedure: LUMBAR TWO-THREE DECOMPRESSION, RIGHT LUMBAR TWO-THREE  MICRODISCECTOMY;  Surgeon: Eldred Manges, MD;  Location: [facility] OR;  Service: Orthopedics;  Laterality: N/A;   TOOTH EXTRACTION     Social History   Occupational History   Not on file  Tobacco Use   Smoking status: Never   Smokeless tobacco: Never  Vaping Use   Vaping Use: Never used  Substance and Sexual Activity   Alcohol use: No   Drug use: No   Sexual activity: Not on file

## 2021-06-30 ENCOUNTER — Ambulatory Visit: Payer: Self-pay | Attending: Internal Medicine | Admitting: Internal Medicine

## 2021-06-30 ENCOUNTER — Other Ambulatory Visit: Payer: Self-pay

## 2021-06-30 ENCOUNTER — Encounter: Payer: Self-pay | Admitting: Internal Medicine

## 2021-06-30 VITALS — BP 122/70 | HR 69 | Resp 16 | Wt 213.0 lb

## 2021-06-30 DIAGNOSIS — Z1211 Encounter for screening for malignant neoplasm of colon: Secondary | ICD-10-CM

## 2021-06-30 DIAGNOSIS — Z1159 Encounter for screening for other viral diseases: Secondary | ICD-10-CM

## 2021-06-30 DIAGNOSIS — E785 Hyperlipidemia, unspecified: Secondary | ICD-10-CM

## 2021-06-30 DIAGNOSIS — E1169 Type 2 diabetes mellitus with other specified complication: Secondary | ICD-10-CM

## 2021-06-30 DIAGNOSIS — E669 Obesity, unspecified: Secondary | ICD-10-CM

## 2021-06-30 DIAGNOSIS — Z2821 Immunization not carried out because of patient refusal: Secondary | ICD-10-CM

## 2021-06-30 LAB — POCT GLYCOSYLATED HEMOGLOBIN (HGB A1C): HbA1c, POC (controlled diabetic range): 7.9 % — AB (ref 0.0–7.0)

## 2021-06-30 LAB — GLUCOSE, POCT (MANUAL RESULT ENTRY): POC Glucose: 185 mg/dl — AB (ref 70–99)

## 2021-06-30 MED ORDER — METFORMIN HCL ER 500 MG PO TB24
500.0000 mg | ORAL_TABLET | Freq: Two times a day (BID) | ORAL | 3 refills | Status: DC
  Filled 2021-06-30: qty 60, 30d supply, fill #0

## 2021-06-30 MED ORDER — ATORVASTATIN CALCIUM 10 MG PO TABS
ORAL_TABLET | Freq: Every day | ORAL | 1 refills | Status: DC
  Filled 2021-06-30: qty 30, 30d supply, fill #0

## 2021-06-30 NOTE — Patient Instructions (Signed)
Alimentacin saludable Healthy Eating Seguir una modalidad de alimentacin saludable puede ayudarlo a alcanzar y mantener un peso saludable, reducir el riesgo de tener enfermedades crnicas y vivir una vida larga y productiva. Es importante que siga una modalidad de alimentacin saludable con un nivel adecuado de caloras para su cuerpo. Debe cubrir sus necesidades nutricionales principalmente a travs de los alimentos, escogiendo una variedad de alimentos ricos en nutrientes. Cules son algunos consejos para seguir este plan? Lea las etiquetas de los alimentos Lea las etiquetas y elija las que digan lo siguiente: Reducido en sodio o con bajo contenido de sodio. Jugos con 100 % jugo de fruta. Alimentos con bajo contenido de grasas saturadas y alto contenido de grasas poliinsaturadas y monoinsaturadas. Alimentos con cereales integrales, como trigo integral, trigo partido, arroz integral y arroz salvaje. Cereales integrales fortificados con cido flico. Se recomienda a las mujeres embarazadas o que desean quedar embarazadas. Lea las etiquetas y evite: Los alimentos con una gran cantidad de azcares agregados. Estos incluyen los alimentos que contienen azcar moreno, endulzante a base de maz, jarabe de maz, dextrosa, fructosa, glucosa, jarabe de maz de alta fructosa, miel, azcar invertido, lactosa, jarabe de malta, maltosa, melaza, azcar sin refinar, sacarosa, trehalosa y azcar turbinado. No consuma ms que las siguientes cantidades de azcar agregada por da: 6 cucharaditas (25 g) las mujeres. 9 cucharaditas (38 g) los hombres. Los alimentos que contienen almidones y cereales refinados o procesados. Los productos de cereales refinados, como harina blanca, harina de maz desgerminada, pan blanco y arroz blanco. Al ir de compras Elija refrigerios ricos en nutrientes, como verduras, frutas enteras y frutos secos. Evite los refrigerios con alto contenido de caloras y azcar, como las papas  fritas, los refrigerios frutales y los caramelos. Use alios y productos para untar a base de aceite con los alimentos en lugar de grasas slidas como la mantequilla, la margarina en barra o el queso crema. Limite las salsas, las mezclas y los productos "instantneos" preelaborados como el arroz saborizado, los fideos instantneos y las pastas listas para comer. Pruebe ms fuentes de protena vegetal, como tofu, tempeh, frijoles negros, edamame, lentejas, frutos secos y semillas. Explore planes de alimentacin como la dieta mediterrnea o la dieta vegetariana. Al cocinar Use aceite para saltear los alimentos en lugar de grasas slidas como mantequilla, margarina en barra o manteca de cerdo. En lugar de frer, trate de cocinar en el horno, en la plancha o en la parrilla, o hervir los alimentos. Retire la parte grasa de las carnes antes de cocinarlas. Cocine las verduras al vapor en agua o caldo. Planificacin de las comidas  En las comidas, imagine dividir su plato en cuartos: La mitad del plato tiene frutas y verduras. Un cuarto del plato tiene cereales integrales. Un cuarto del plato tiene protena, especialmente carnes magras, aves, huevos, tofu, frijoles o frutos secos. Incluya lcteos descremados en su dieta diaria. Estilo de vida Elija opciones saludables en todos los mbitos, como en el hogar, el trabajo, la escuela, los restaurantes y las tiendas. Prepare los alimentos de un modo seguro: Lvese las manos despus de manipular carnes crudas. Mantenga las superficies de preparacin de los alimentos limpias lavndolas regularmente con agua caliente y jabn. Mantenga las carnes crudas separadas de los alimentos que estn listos para comer como las frutas y las verduras. Cocine los frutos de mar, carnes, aves y huevos hasta alcanzar la temperatura interna recomendada. Almacene los alimentos a temperaturas seguras. En general: Mantenga los alimentos fros a una temperatura de 40   F (4,4 C)  o inferior. Mantenga los alimentos calientes a una temperatura de 140 F (60 C) o superior. Mantenga el congelador a una temperatura de 0 F (-17,8 C) o inferior. Los alimentos dejan de ser seguros para su consumo cuando han estado a una temperatura de entre 40 y 140 F (4,4 y 60 C) por ms de 2 horas. Qu alimentos debo consumir? Frutas Propngase comer el equivalente a 2 tazas de frutas frescas, enlatadas (en su jugo natural) o congeladas cada da. Algunos ejemplos de equivalentes a 1 taza de frutas son 1 manzana pequea, 8 fresas grandes, 1 taza de fruta enlatada,  taza de fruta desecada o 1 taza de jugo 100 %. Verduras Propngase comer el equivalente a 2 o 3 tazas de verduras frescas y congeladas cada da, incluyendo diferentes variedades y colores. Algunos ejemplos de equivalentes a 1 taza de verduras son 2 zanahorias medianas, 2 tazas de verduras de hoja verde crudas, 1 taza de verduras cortadas (crudas o cocidas) o 1 papa mediana al horno. Granos Propngase comer el equivalente a 6 onzas de cereales integrales por da. Algunos ejemplos de equivalentes a 1 onza de cereales son 1 rebanada de pan, 1 taza de cereal listo para comer, 3 tazas de palomitas de maz o  taza de arroz, pasta o cereales cocidos. Carnes y otras protenas Propngase comer el equivalente a 5 o 6 onzas de protena por da. Algunos ejemplos de equivalentes a 1 onza de protena son 1 huevo,  taza de frutos secos o semillas o 1 cucharada (16 g) de mantequilla de man. Un corte de carne o pescado del tamao de un mazo de cartas equivale aproximadamente a 3 a 4 onzas. De las protenas que consume cada semana, intente que al menos 8 onzas provengan de frutos de mar. Esto incluye el salmn, la trucha, el arenque y las anchoas. Lcteos Propngase comer el equivalente a 3 tazas de lcteos descremados o con bajo contenido de grasa cada da. Algunos ejemplos de equivalentes a 1 taza de lcteos son 1 taza (240 ml) de leche, 8  onzas (250 g) de yogur, 1 onzas (44 g) de queso natural o 1 taza (240 ml) de leche de soja fortificada. Grasas y aceites Propngase consumir alrededor de 5 cucharaditas (21 g) por da. Elija grasas monoinsaturadas, como el aceite de canola y de oliva, aguacate, mantequilla de man y la mayora de los frutos secos, o bien grasas poliinsaturadas, como el aceite de girasol, maz y soja, nueces, piones, semillas de ssamo, semillas de girasol y semillas de lino. Bebidas Propngase beber seis vasos de 8 onzas de agua por da. Limite el caf a entre tres y cinco tazas de 8 onzas por da. Limite el consumo de bebidas con cafena que tengan caloras agregadas, como los refrescos y las bebidas energizantes. Limite el consumo de alcohol a no ms de 1 medida por da si es mujer y no est embarazada, y a 2 medidas por da si es hombre. Una medida equivale a 12 onzas de cerveza (355 ml), 5 onzas de vino (148 ml) o 1 onzas de bebidas alcohlicas de alta graduacin (44 ml). Condimentos y otros alimentos Evite agregar cantidades excesivas de sal a los alimentos. Pruebe darles sabor con hierbas y especias en lugar de sal. Evite agregar azcar a los alimentos. Pruebe usar alios, salsas y productos untables a base de aceite en lugar de grasas slidas. Esta informacin se basa en las pautas generales de nutricin de los [place] Para obtener   ms informacin, visite choosemyplate.gov. Las cantidades exactas pueden variar en funcin de sus necesidades nutricionales. Resumen Un plan de alimentacin saludable puede ayudarlo a mantener un peso saludable, reducir el riesgo de tener enfermedades crnicas y mantenerse activo durante toda su vida. Planifique sus comidas. Asegrese de consumir las porciones correctas de una variedad de alimentos ricos en nutrientes. En lugar de frer, trate de cocinar en el horno, en la plancha o en la parrilla, o hervir los alimentos. Elija opciones saludables en todos los mbitos, como en el  hogar, el trabajo, la escuela, los restaurantes y las tiendas. Esta informacin no tiene como fin reemplazar el consejo del mdico. Asegrese de hacerle al mdico cualquier pregunta que tenga. Document Revised: 03/13/2018 Document Reviewed: 03/13/2018 Elsevier Patient Education  2022 Elsevier Inc.  

## 2021-06-30 NOTE — Progress Notes (Signed)
Patient ID: Allen Lowery, male    DOB: [birth year]  MRN: [id number]  CC: Diabetes   Subjective: Allen Lowery is a 60 y.o. male who presents for chronic ds management His concerns today include:  Patient with history of DM type II, Bell's palsy, HL, chronic radicular LBP.   Lumbar radiculopathy:  had lumbar decompression 6/227/2022 with Dr. Ophelia Charter.  Pt reports surgery went well and able to walk more.  Not on pain med.  DIABETES TYPE 2/Obesity Last A1C:   Results for orders placed or performed in visit on 06/30/21  POCT glucose (manual entry)  Result Value Ref Range   POC Glucose 185 (A) 70 - 99 mg/dl  POCT glycosylated hemoglobin (Hb A1C)  Result Value Ref Range   Hemoglobin A1C     HbA1c POC (<> result, manual entry)     HbA1c, POC (prediabetic range)     HbA1c, POC (controlled diabetic range) 7.9 (A) 0.0 - 7.0 %    Med Adherence:  []  Yes    [x]  No -Metformin increased to 500 mg BID on last visit but he is only taking once a day.  Novolog 20 units PRN from hosp but not taking now. Medication side effects:  []  Yes    [x]  No Home Monitoring?  [x]  Yes  -once a day but has not checked any this wk Home glucose results range: highest 130 Diet Adherence: [x]  Yes    []  No Exercise: [x]  Yes  -walking 30 mins daily Hypoglycemic episodes?: []  Yes    [x]  No Numbness of the feet? []  Yes    [x]  No Retinopathy hx? []  Yes    [x]  No Last eye exam: over due for eye exam.  No insurance. Comments:   HL:  he stopped taking Lipitor.  Felt he was on too many meds.  BP elev again today and was on last visit.  I note BP was okay on vist with the health system in June and July  HM:  declines flu shot.  Due for colon CA screen. Wants c-scope instead of fit test.   Patient Active Problem List   Diagnosis Date Noted   History of lumbar laminectomy for spinal cord decompression 05/06/2021   Health care maintenance 05/01/2020   T2DM (type 2 diabetes mellitus) (HCC) 04/01/2020    Blurred vision, right eye 04/01/2020   Ear abrasion 04/01/2020   Bell's palsy 03/15/2020     Current Outpatient Medications on File Prior to Visit  Medication Sig Dispense Refill   insulin aspart (NOVOLOG) 100 UNIT/ML injection Inject 20 Units into the skin daily as needed (blood sugar above 145).     metFORMIN (GLUCOPHAGE XR) 500 MG 24 hr tablet Take 1 tablet (500 mg total) by mouth 2 times daily at 12 noon and 4 pm. 180 tablet 3   atorvastatin (LIPITOR) 10 MG tablet TAKE 1 TABLET (10 MG TOTAL) BY MOUTH DAILY. (Patient not taking: Reported on 06/30/2021) 30 tablet 6   gabapentin (NEURONTIN) 300 MG capsule TAKE 1 CAPSULE BY MOUTH EVERY NIGHT AT BEDTIME FOR 1 WEEK THEN TAKE 1 CAPSULE BY MOUTH 2 TIMES DAILY (Patient not taking: Reported on 06/30/2021) 60 capsule 3   methocarbamol (ROBAXIN) 500 MG tablet Take 1 tablet (500 mg total) by mouth every 6 (six) hours as needed for muscle spasms. (Patient not taking: Reported on 06/30/2021) 40 tablet 0   oxyCODONE-acetaminophen (PERCOCET/ROXICET) 5-325 MG tablet Take 1 tablet by mouth every 4 (four) hours as needed for severe pain. (  Patient not taking: Reported on 06/30/2021) 50 tablet 0   oxyCODONE-acetaminophen (PERCOCET/ROXICET) 5-325 MG tablet Take 1 tablet by mouth every 6 (six) hours as needed for severe pain. (Patient not taking: Reported on 06/30/2021) 20 tablet 0   No current facility-administered medications on file prior to visit.    Allergies  Allergen Reactions   Aspirin     Gi upset    Social History   Socioeconomic History   Marital status: Single    Spouse name: Not on file   Number of children: Not on file   Years of education: Not on file   Highest education level: Not on file  Occupational History   Not on file  Tobacco Use   Smoking status: Never   Smokeless tobacco: Never  Vaping Use   Vaping Use: Never used  Substance and Sexual Activity   Alcohol use: No   Drug use: No   Sexual activity: Not on file  Other Topics  Concern   Not on file  Social History Narrative   ** Merged History Encounter **       Social Determinants of Health   Financial Resource Strain: Not on file  Food Insecurity: Not on file  Transportation Needs: Not on file  Physical Activity: Not on file  Stress: Not on file  Social Connections: Not on file  Intimate Partner Violence: Not on file    Family History  Problem Relation Age of Onset   Diabetes Mother    Heart disease Neg Hx    Hypertension Neg Hx    Cancer Neg Hx     Past Surgical History:  Procedure Laterality Date   KIDNEY STONE SURGERY     kidney stones     LUMBAR LAMINECTOMY N/A 04/27/2021   Procedure: LUMBAR TWO-THREE DECOMPRESSION, RIGHT LUMBAR TWO-THREE MICRODISCECTOMY;  Surgeon: Eldred Manges, MD;  Location: [facility] OR;  Service: Orthopedics;  Laterality: N/A;   TOOTH EXTRACTION      ROS: Review of Systems Negative except as stated above  PHYSICAL EXAM: BP (!) 144/89   Pulse 69   Resp 16   Wt 213 lb (96.6 kg)   SpO2 96%   BMI 34.38 kg/m   Wt Readings from Last 3 Encounters:  06/30/21 213 lb (96.6 kg)  06/09/21 207 lb (93.9 kg)  05/06/21 207 lb (93.9 kg)    Physical Exam  General appearance - alert, well appearing, and in no distress Mouth - mucous membranes moist, pharynx normal without lesions Chest - clear to auscultation, no wheezes, rales or rhonchi, symmetric air entry Heart - normal rate, regular rhythm, normal S1, S2, no murmurs, rubs, clicks or gallops Extremities - peripheral pulses normal, no pedal edema, no clubbing or cyanosis Diabetic Foot Exam - Simple   Simple Foot Form Visual Inspection No deformities, no ulcerations, no other skin breakdown bilaterally: Yes Sensation Testing Intact to touch and monofilament testing bilaterally: Yes Pulse Check Posterior Tibialis and Dorsalis pulse intact bilaterally: Yes Comments Toe nails slightly overgrown     CMP Latest Ref Rng & Units 04/24/2021 07/11/2020 03/14/2020  Glucose 70  - 99 mg/dL 431(V) 400(Q) 676(P)  BUN 6 - 20 mg/dL 14 18 20   Creatinine 0.61 - 1.24 mg/dL 9.50 9.32  Sodium 135 - 145 mmol/L 139 139 133(L)  Potassium 3.5 - 5.1 mmol/L 4.2 5.1 4.4  Chloride 98 - 111 mmol/L 105 102 100  CO2 22 - 32 mmol/L 28 25 22   Calcium 8.9 - 10.3 mg/dL 9.4 6.71  9.3  Total Protein 6.0 - 8.5 g/dL - 7.0 6.5  Total Bilirubin 0.0 - 1.2 mg/dL - 0.4 0.6  Alkaline Phos 48 - 121 IU/L - 63 91  AST 0 - 40 IU/L - 17 19  ALT 0 - 44 IU/L - 25 27   Lipid Panel     Component Value Date/Time   CHOL 200 (H) 07/11/2020 0954   TRIG 122 07/11/2020 0954   HDL 42 07/11/2020 0954   CHOLHDL 4.8 07/11/2020 0954   CHOLHDL 4.9 11/25/2010 0503   VLDL 24 11/25/2010 0503   LDLCALC 136 (H) 07/11/2020 0954    CBC    Component Value Date/Time   WBC 9.2 04/24/2021 1314   RBC 4.93 04/24/2021 1314   HGB 14.8 04/24/2021 1314   HCT 45.1 04/24/2021 1314   PLT 291 04/24/2021 1314   MCV 91.5 04/24/2021 1314   MCH 30.0 04/24/2021 1314   MCHC 32.8 04/24/2021 1314   RDW 12.6 04/24/2021 1314   LYMPHSABS 2.2 03/14/2020 1900   MONOABS 0.6 03/14/2020 1900   EOSABS 0.3 03/14/2020 1900   BASOSABS 0.1 03/14/2020 1900   The 10-year ASCVD risk score Denman George DC Jr., et al., 2013) is: 15.5%   Values used to calculate the score:     Age: 37 years     Sex: Male     Is Non-Hispanic African American: No     Diabetic: Yes     Tobacco smoker: No     Systolic Blood Pressure: 122 mmHg     Is BP treated: No     HDL Cholesterol: 42 mg/dL     Total Cholesterol: 200 mg/dL  ASSESSMENT AND PLAN: 1. Type 2 diabetes mellitus with obesity (HCC) Not at goal.  Recommend taking metformin twice a day as prescribed. Continue healthy eating habits and regular exercise. - POCT glucose (manual entry) - POCT glycosylated hemoglobin (Hb A1C) - Microalbumin / creatinine urine ratio - metFORMIN (GLUCOPHAGE XR) 500 MG 24 hr tablet; Take 1 tablet (500 mg total) by mouth 2 times daily at 12 noon and 4 pm.  Dispense: 180  tablet; Refill: 3  2. Hyperlipidemia associated with type 2 diabetes mellitus (HCC) Recommend restarting atorvastatin.  Discussed increased risk for cardiovascular events.  Patient willing to restart the medication. - Lipid panel - Hepatic Function Panel - atorvastatin (LIPITOR) 10 MG tablet; TAKE 1 TABLET (10 MG TOTAL) BY MOUTH DAILY.  Dispense: 90 tablet; Refill: 1  3. Need for hepatitis C screening test - Hepatitis C Antibody  4. Screening for colon cancer - Ambulatory referral to Gastroenterology  5. Influenza vaccination declined Recommended.  Patient declined.  Patient was given the opportunity to ask questions.  Patient verbalized understanding of the plan and was able to repeat key elements of the plan.  [employer] Language interpreter used during this encounter. #[id number], Monica  Orders Placed This Encounter  Procedures   POCT glucose (manual entry)   POCT glycosylated hemoglobin (Hb A1C)     Requested Prescriptions    No prescriptions requested or ordered in this encounter    No follow-ups on file.  Jonah Blue, MD, FACP

## 2021-07-01 NOTE — Progress Notes (Signed)
Let patient know that his cholesterol level is 125 with goal being less than 70.  This increases his risk for heart attack and strokes.  Take the atorvastatin as prescribed to help lower cholesterol.  Liver function tests normal.  Screen for hepatitis C is negative.

## 2021-07-02 ENCOUNTER — Telehealth: Payer: Self-pay

## 2021-07-02 NOTE — Telephone Encounter (Signed)
[employer] [place]  Id# [phone]  contacted pt to go over lab results pt is aware and doesn't have any questions or concerns

## 2021-07-03 ENCOUNTER — Ambulatory Visit: Payer: Self-pay

## 2021-07-07 ENCOUNTER — Other Ambulatory Visit: Payer: Self-pay

## 2021-07-13 LAB — LIPID PANEL
Chol/HDL Ratio: 5.1 ratio — ABNORMAL HIGH (ref 0.0–5.0)
Cholesterol, Total: 189 mg/dL (ref 100–199)
HDL: 37 mg/dL — ABNORMAL LOW (ref >39)
LDL Chol Calc (NIH): 125 mg/dL — ABNORMAL HIGH (ref 0–99)
Triglycerides: 151 mg/dL — ABNORMAL HIGH (ref 0–149)
VLDL Cholesterol Cal: 27 mg/dL (ref 5–40)

## 2021-07-13 LAB — MICROALBUMIN / CREATININE URINE RATIO

## 2021-07-13 LAB — HEPATIC FUNCTION PANEL
ALT: 22 IU/L (ref 0–44)
AST: 20 IU/L (ref 0–40)
Albumin: 4.5 g/dL (ref 3.8–4.9)
Alkaline Phosphatase: 77 IU/L (ref 44–121)
Bilirubin Total: 0.2 mg/dL (ref 0.0–1.2)
Bilirubin, Direct: <0.1 mg/dL (ref 0.00–0.40)
Total Protein: 6.9 g/dL (ref 6.0–8.5)

## 2021-07-13 LAB — HEPATITIS C ANTIBODY: Hep C Virus Ab: <0.1 s/co ratio (ref 0.0–0.9)

## 2021-07-15 ENCOUNTER — Telehealth: Payer: Self-pay | Admitting: Internal Medicine

## 2021-07-15 NOTE — Telephone Encounter (Signed)
Copied from CRM [id number]. Topic: General - Other >> Jul 15, 2021 11:02 AM Darron Doom wrote: Reason for CRM: Patient daughter Lianne Moris called in to schedule an appointment with Mikle Bosworth patients orange card expired. Please call Ph# [phone]

## 2021-07-16 NOTE — Telephone Encounter (Signed)
I return call, schedule a financial appt for 07/24/21

## 2021-07-24 ENCOUNTER — Ambulatory Visit: Payer: Self-pay | Attending: Internal Medicine

## 2021-07-24 ENCOUNTER — Other Ambulatory Visit: Payer: Self-pay

## 2021-09-09 ENCOUNTER — Ambulatory Visit: Payer: Self-pay

## 2021-09-09 NOTE — Telephone Encounter (Signed)
Third attempt to reach pt at numbers provided. Unable to leave VM per Interpreter. Will route to provider for resolution per protocol.

## 2021-09-09 NOTE — Telephone Encounter (Addendum)
[address] [employer] [place] #  [id number] connected before contacting pt  Pt called at [phone] and unable to leave VM, called daughter Anabel at [phone] and left VM to call office back to speak with a nurse regarding symptoms for father. Will attempt at later time.   Summary: Migraines / cold symtpoms - no appt   Pt called reporting that the patient has migraines and cold symptoms; fatigued. Negative covid test. Seeking sooner appt than what is available  Best contact: [phone]   Needs interpreter

## 2021-10-20 ENCOUNTER — Other Ambulatory Visit: Payer: Self-pay

## 2021-10-20 ENCOUNTER — Ambulatory Visit: Payer: Self-pay | Attending: Internal Medicine | Admitting: Internal Medicine

## 2021-10-20 VITALS — BP 128/84 | HR 83 | Ht 63.0 in | Wt 204.0 lb

## 2021-10-20 DIAGNOSIS — E1169 Type 2 diabetes mellitus with other specified complication: Secondary | ICD-10-CM

## 2021-10-20 DIAGNOSIS — E669 Obesity, unspecified: Secondary | ICD-10-CM

## 2021-10-20 DIAGNOSIS — E785 Hyperlipidemia, unspecified: Secondary | ICD-10-CM

## 2021-10-20 DIAGNOSIS — Z91199 Patient's noncompliance with other medical treatment and regimen due to unspecified reason: Secondary | ICD-10-CM

## 2021-10-20 DIAGNOSIS — Z2821 Immunization not carried out because of patient refusal: Secondary | ICD-10-CM

## 2021-10-20 LAB — POCT GLYCOSYLATED HEMOGLOBIN (HGB A1C): HbA1c, POC (controlled diabetic range): 14 % — AB (ref 0.0–7.0)

## 2021-10-20 LAB — GLUCOSE, POCT (MANUAL RESULT ENTRY): POC Glucose: 338 mg/dl — AB (ref 70–99)

## 2021-10-20 MED ORDER — INSULIN PEN NEEDLE 31G X 8 MM MISC
6 refills | Status: AC
  Filled 2021-10-20: qty 100, 25d supply, fill #0

## 2021-10-20 MED ORDER — METFORMIN HCL ER 500 MG PO TB24
500.0000 mg | ORAL_TABLET | Freq: Two times a day (BID) | ORAL | 3 refills | Status: DC
  Filled 2021-10-20: qty 180, 90d supply, fill #0

## 2021-10-20 MED ORDER — ATORVASTATIN CALCIUM 10 MG PO TABS
ORAL_TABLET | Freq: Every day | ORAL | 1 refills | Status: DC
  Filled 2021-10-20: qty 30, 30d supply, fill #0
  Filled 2021-12-02: qty 90, 90d supply, fill #0

## 2021-10-20 MED ORDER — BASAGLAR KWIKPEN 100 UNIT/ML ~~LOC~~ SOPN
10.0000 [IU] | PEN_INJECTOR | Freq: Every day | SUBCUTANEOUS | 99 refills | Status: DC
  Filled 2021-10-20: qty 3, 30d supply, fill #0

## 2021-10-20 NOTE — Progress Notes (Signed)
Patient feeling thirsty and weak. About 4 days.

## 2021-10-20 NOTE — Progress Notes (Signed)
Patient ID: Allen Lowery, male    DOB: [birth year]  MRN: [id number]  CC: Diabetes   Subjective: Allen Lowery is a 60 y.o. male who presents for chronic ds management His concerns today include:  Patient with history of DM type II, Bell's palsy, HL, chronic radicular LBP (hx lumbar decompression surgery 04/2021).   DIABETES TYPE 2 Last A1C:   Results for orders placed or performed in visit on 10/20/21  POCT glucose (manual entry)  Result Value Ref Range   POC Glucose 338 (A) 70 - 99 mg/dl  POCT glycosylated hemoglobin (Hb A1C)  Result Value Ref Range   Hemoglobin A1C     HbA1c POC (<> result, manual entry)     HbA1c, POC (prediabetic range)     HbA1c, POC (controlled diabetic range) 14.0 (A) 0.0 - 7.0 %   A1C 4 mths ago was 7.9 Med Adherence:  On last visit, he was taking Metformin once a day instead of BID as was prescribed.  Advised to take BID as prescribed.  Since then he reports he stopped taking Metformin 2 mths ago because "I was feeling fine." Endorse blurred vision, polyuria/polydipsia and weakness in legs. Medication side effects:  []  Yes    [x]  No Home Monitoring?  []  Yes    [x]  No but does have device Home glucose results range: Diet Adherence: []  Yes    [x]  No Exercise: []  Yes    [x]  No Hypoglycemic episodes?: []  Yes    []  No Numbness of the feet? []  Yes    []  No Retinopathy hx? []  Yes    []  No Last eye exam: over due for eye exam.  No insurance.  Comments:   HL:  he was not taking Lipitor consistently on last visit.  Advised to do so.  Today he tells me he still is not taking it because "I was feeling well."  HM:  decline PCV 20.  Never called for c-scope; referred on last visit.   Patient Active Problem List   Diagnosis Date Noted   History of lumbar laminectomy for spinal cord decompression 05/06/2021   Health care maintenance 05/01/2020   T2DM (type 2 diabetes mellitus) (HCC) 04/01/2020   Blurred vision, right eye 04/01/2020   Ear  abrasion 04/01/2020   Bell's palsy 03/15/2020     Current Outpatient Medications on File Prior to Visit  Medication Sig Dispense Refill   metFORMIN (GLUCOPHAGE XR) 500 MG 24 hr tablet Take 1 tablet (500 mg total) by mouth 2 times daily at 12 noon and 4 pm. 180 tablet 3   atorvastatin (LIPITOR) 10 MG tablet TAKE 1 TABLET (10 MG TOTAL) BY MOUTH DAILY. (Patient not taking: Reported on 10/20/2021) 90 tablet 1   gabapentin (NEURONTIN) 300 MG capsule TAKE 1 CAPSULE BY MOUTH EVERY NIGHT AT BEDTIME FOR 1 WEEK THEN TAKE 1 CAPSULE BY MOUTH 2 TIMES DAILY (Patient not taking: Reported on 06/30/2021) 60 capsule 3   No current facility-administered medications on file prior to visit.    Allergies  Allergen Reactions   Aspirin     Gi upset    Social History   Socioeconomic History   Marital status: Single    Spouse name: Not on file   Number of children: Not on file   Years of education: Not on file   Highest education level: Not on file  Occupational History   Not on file  Tobacco Use   Smoking status: Never   Smokeless tobacco: Never  Vaping Use   Vaping Use: Never used  Substance and Sexual Activity   Alcohol use: No   Drug use: No   Sexual activity: Not on file  Other Topics Concern   Not on file  Social History Narrative   ** Merged History Encounter **       Social Determinants of Health   Financial Resource Strain: Not on file  Food Insecurity: Not on file  Transportation Needs: Not on file  Physical Activity: Not on file  Stress: Not on file  Social Connections: Not on file  Intimate Partner Violence: Not on file    Family History  Problem Relation Age of Onset   Diabetes Mother    Heart disease Neg Hx    Hypertension Neg Hx    Cancer Neg Hx     Past Surgical History:  Procedure Laterality Date   KIDNEY STONE SURGERY     kidney stones     LUMBAR LAMINECTOMY N/A 04/27/2021   Procedure: LUMBAR TWO-THREE DECOMPRESSION, RIGHT LUMBAR TWO-THREE MICRODISCECTOMY;   Surgeon: Eldred Manges, MD;  Location: [facility] OR;  Service: Orthopedics;  Laterality: N/A;   TOOTH EXTRACTION      ROS: Review of Systems Negative except as stated above  PHYSICAL EXAM: BP 128/84    Pulse 83    Ht 5\' 3"  (1.6 m)    Wt 204 lb (92.5 kg)    SpO2 96%    BMI 36.14 kg/m   Wt Readings from Last 3 Encounters:  10/20/21 204 lb (92.5 kg)  06/30/21 213 lb (96.6 kg)  06/09/21 207 lb (93.9 kg)    Physical Exam  General appearance - alert, well appearing, and in no distress Mental status - normal mood, behavior, speech, dress, motor activity, and thought processes Neck - supple, no significant adenopathy Chest - clear to auscultation, no wheezes, rales or rhonchi, symmetric air entry Heart - normal rate, regular rhythm, normal S1, S2, no murmurs, rubs, clicks or gallops Extremities - peripheral pulses normal, no pedal edema, no clubbing or cyanosis Diabetic Foot Exam - Simple   Simple Foot Form  10/20/2021  9:46 AM  Visual Inspection See comments: Yes Sensation Testing Intact to touch and monofilament testing bilaterally: Yes Pulse Check Posterior Tibialis and Dorsalis pulse intact bilaterally: Yes Comments Slight hammertoe of both first toes.  Toenails slightly overgrown on toes 2 through 5 bilaterally.     CMP Latest Ref Rng & Units 06/30/2021 04/24/2021 07/11/2020  Glucose 70 - 99 mg/dL - 09/10/2020) 333(L)  BUN 6 - 20 mg/dL - 14 18  Creatinine 456(Y - 1.24 mg/dL - 5.63 8.93  Sodium 7.34 - 145 mmol/L - 139 139  Potassium 3.5 - 5.1 mmol/L - 4.2 5.1  Chloride 98 - 111 mmol/L - 105 102  CO2 22 - 32 mmol/L - 28 25  Calcium 8.9 - 10.3 mg/dL - 9.4 287  Total Protein 6.0 - 8.5 g/dL 6.9 - 7.0  Total Bilirubin 0.0 - 1.2 mg/dL 0.2 - 0.4  Alkaline Phos 44 - 121 IU/L 77 - 63  AST 0 - 40 IU/L 20 - 17  ALT 0 - 44 IU/L 22 - 25   Lipid Panel     Component Value Date/Time   CHOL 189 06/30/2021 1020   TRIG 151 (H) 06/30/2021 1020   HDL 37 (L) 06/30/2021 1020   CHOLHDL 5.1 (H)  06/30/2021 1020   CHOLHDL 4.9 11/25/2010 0503   VLDL 24 11/25/2010 0503   LDLCALC 125 (H) 06/30/2021 1020  CBC    Component Value Date/Time   WBC 9.2 04/24/2021 1314   RBC 4.93 04/24/2021 1314   HGB 14.8 04/24/2021 1314   HCT 45.1 04/24/2021 1314   PLT 291 04/24/2021 1314   MCV 91.5 04/24/2021 1314   MCH 30.0 04/24/2021 1314   MCHC 32.8 04/24/2021 1314   RDW 12.6 04/24/2021 1314   LYMPHSABS 2.2 03/14/2020 1900   MONOABS 0.6 03/14/2020 1900   EOSABS 0.3 03/14/2020 1900   BASOSABS 0.1 03/14/2020 1900    ASSESSMENT AND PLAN:  1. Type 2 diabetes mellitus with obesity (HCC) Discussed the importance of healthy eating habits, regular aerobic exercise (at least 150 minutes a week as tolerated) and medication compliance to achieve or maintain control of diabetes and prevent future complications. -Given what his A1c is today, I recommend starting him on glargine insulin 10 units at bedtime.  Encouraged him to take the metformin twice a day as prescribed.  Clinical pharmacist did teaching on glargine administration today. Went over signs and symptoms of hypoglycemia and how to treat. - POCT glucose (manual entry) - POCT glycosylated hemoglobin (Hb A1C) - Insulin Glargine (BASAGLAR KWIKPEN) 100 UNIT/ML; Inject 10 Units into the skin at bedtime.  Dispense: 15 mL; Refill: PRN - CBC - Comprehensive metabolic panel - Insulin Pen Needle 31G X 8 MM MISC; use as directed  Dispense: 100 each; Refill: 6 - metFORMIN (GLUCOPHAGE XR) 500 MG 24 hr tablet; Take 1 tablet (500 mg total) by mouth 2 times daily at 12 noon and 4 pm.  Dispense: 180 tablet; Refill: 3  2. Hyperlipidemia associated with type 2 diabetes mellitus (HCC) Discussed cardiovascular risks associated with uncontrolled cholesterol.  Encouraged him to take the atorvastatin. - atorvastatin (LIPITOR) 10 MG tablet; TAKE 1 TABLET (10 MG TOTAL) BY MOUTH DAILY.  Dispense: 90 tablet; Refill: 1  3. Pneumococcal vaccination  declined Recommended.  Patient declined.  4. Medically noncompliant See #1 above.    Patient was given the opportunity to ask questions.  Patient verbalized understanding of the plan and was able to repeat key elements of the plan.  [employer] Language interpreter used during this encounter. #[id number], Damaris  Orders Placed This Encounter  Procedures   POCT glucose (manual entry)   POCT glycosylated hemoglobin (Hb A1C)     Requested Prescriptions    No prescriptions requested or ordered in this encounter    No follow-ups on file.  Jonah Blue, MD, FACP

## 2021-10-21 ENCOUNTER — Other Ambulatory Visit: Payer: Self-pay

## 2021-10-21 LAB — CBC
Hematocrit: 46.6 % (ref 37.5–51.0)
Hemoglobin: 15.3 g/dL (ref 13.0–17.7)
MCH: 29.8 pg (ref 26.6–33.0)
MCHC: 32.8 g/dL (ref 31.5–35.7)
MCV: 91 fL (ref 79–97)
Platelets: 269 10*3/uL (ref 150–450)
RBC: 5.13 x10E6/uL (ref 4.14–5.80)
RDW: 12.9 % (ref 11.6–15.4)
WBC: 8.3 10*3/uL (ref 3.4–10.8)

## 2021-10-21 LAB — COMPREHENSIVE METABOLIC PANEL
ALT: 26 IU/L (ref 0–44)
AST: 20 IU/L (ref 0–40)
Albumin/Globulin Ratio: 2 (ref 1.2–2.2)
Albumin: 4.3 g/dL (ref 3.8–4.9)
Alkaline Phosphatase: 86 IU/L (ref 44–121)
BUN/Creatinine Ratio: 21 — ABNORMAL HIGH (ref 9–20)
BUN: 19 mg/dL (ref 6–24)
Bilirubin Total: 0.4 mg/dL (ref 0.0–1.2)
CO2: 24 mmol/L (ref 20–29)
Calcium: 9.3 mg/dL (ref 8.7–10.2)
Chloride: 99 mmol/L (ref 96–106)
Creatinine, Ser: 0.9 mg/dL (ref 0.76–1.27)
Globulin, Total: 2.2 g/dL (ref 1.5–4.5)
Glucose: 327 mg/dL — ABNORMAL HIGH (ref 70–99)
Potassium: 4.8 mmol/L (ref 3.5–5.2)
Sodium: 135 mmol/L (ref 134–144)
Total Protein: 6.5 g/dL (ref 6.0–8.5)
eGFR: 98 mL/min/{1.73_m2} (ref >59)

## 2021-10-22 ENCOUNTER — Telehealth: Payer: Self-pay

## 2021-10-22 NOTE — Telephone Encounter (Signed)
Contacted pt to go over lab results pt didn't answer lvm   [employer] interpre[employer] Zetta Bills  Id# [phone]    Sent a CRM and forward labs to NT to give pt labs when they call back

## 2021-10-28 ENCOUNTER — Ambulatory Visit: Payer: Self-pay | Admitting: Critical Care Medicine

## 2021-11-11 ENCOUNTER — Telehealth: Payer: Self-pay | Admitting: *Deleted

## 2021-11-11 NOTE — Telephone Encounter (Signed)
Copied from CRM [id number]. Topic: Referral - Request for Referral >> Nov 09, 2021 10:46 AM Randol Kern wrote: Has patient seen PCP for this complaint? Yes *If NO, is insurance requiring patient see PCP for this issue before PCP can refer them? Referral for which specialty: Dentistry  Preferred provider/office: [phone] [facility] Reason for referral: Cavity/tooth pain >> Nov 11, 2021  2:37 PM Gaetana Michaelis A wrote: The patient's daughter has made an additional phone call regarding this matter  Please contact further when available

## 2021-11-11 NOTE — Telephone Encounter (Signed)
Will mail dentistry resources to address on file.

## 2021-11-12 ENCOUNTER — Ambulatory Visit: Payer: Self-pay | Admitting: Pharmacist

## 2021-11-20 ENCOUNTER — Ambulatory Visit: Payer: Self-pay | Attending: Internal Medicine | Admitting: Pharmacist

## 2021-11-20 ENCOUNTER — Encounter: Payer: Self-pay | Admitting: Pharmacist

## 2021-11-20 ENCOUNTER — Other Ambulatory Visit: Payer: Self-pay

## 2021-11-20 DIAGNOSIS — E669 Obesity, unspecified: Secondary | ICD-10-CM

## 2021-11-20 DIAGNOSIS — E1169 Type 2 diabetes mellitus with other specified complication: Secondary | ICD-10-CM

## 2021-11-20 MED ORDER — BASAGLAR KWIKPEN 100 UNIT/ML ~~LOC~~ SOPN
8.0000 [IU] | PEN_INJECTOR | Freq: Every day | SUBCUTANEOUS | 99 refills | Status: DC
  Filled 2021-11-20: qty 3, 28d supply, fill #0

## 2021-11-20 MED ORDER — METFORMIN HCL ER 500 MG PO TB24
1000.0000 mg | ORAL_TABLET | Freq: Two times a day (BID) | ORAL | 2 refills | Status: DC
  Filled 2021-11-20 – 2021-12-24 (×2): qty 120, 30d supply, fill #0

## 2021-11-20 NOTE — Progress Notes (Signed)
° ° °  S:    PCP: Dr. Laural Benes  No chief complaint on file.  Patient arrives in good spirits.  Presents for diabetes evaluation, education, and management Patient was referred and last seen by Primary Care Provider on 10/20/2021.    Patient reports Diabetes was diagnosed >10 years ago. He maintained pretty good control in 2022, however, his last A1c was found to be 14% (10/20/2021) d/t medication noncompliance. Dr. Laural Benes started Lantus and had patient resume metformin at that appointment.   Family/Social History:  Fhx: DM Tobacco: never smoker   Alcohol: denies   Insurance coverage/medication affordability: self pay   Medication adherence reported.   Current diabetes medications include: Lantus 10 units daily, metformin 500 mg XR BID  Current hyperlipidemia medications include: atorvastatin 10 mg daily  Patient denies hypoglycemic events.  Patient reported dietary habits:  -Has eliminated sugar and carbohydrates from his diet   Patient-reported exercise habits: none   Reports feeling much better since his last PCP visit.  Patient denies nocturia (nighttime urination).  Patient denies neuropathy (nerve pain). Patient denies visual changes. Patient reports self foot exams.     O:  Physical Exam  ROS  Lab Results  Component Value Date   HGBA1C 14.0 (A) 10/20/2021   There were no vitals filed for this visit.  Lipid Panel     Component Value Date/Time   CHOL 189 06/30/2021 1020   TRIG 151 (H) 06/30/2021 1020   HDL 37 (L) 06/30/2021 1020   CHOLHDL 5.1 (H) 06/30/2021 1020   CHOLHDL 4.9 11/25/2010 0503   VLDL 24 11/25/2010 0503   LDLCALC 125 (H) 06/30/2021 1020   Home CBGs. No meter with him today.  Home fasting blood sugars: 108 - 140  2 hour post-meal/random blood sugars: 132 - 140.  Clinical Atherosclerotic Cardiovascular Disease (ASCVD): No  The 10-year ASCVD risk score (Arnett DK, et al., 2019) is: 17.4%   Values used to calculate the score:     Age: 61  years     Sex: Male     Is Non-Hispanic African American: No     Diabetic: Yes     Tobacco smoker: No     Systolic Blood Pressure: 128 mmHg     Is BP treated: No     HDL Cholesterol: 37 mg/dL     Total Cholesterol: 189 mg/dL    A/P: Diabetes longstanding currently uncontrolled based on A1c, however, reported home blood sugars are improving. Patient is able to verbalize appropriate hypoglycemia management plan. Medication adherence appears appropriate. He tells me that he has never experienced side effects with the higher doses of metformin. I recommend to titrate this to 1000 mg BID.  -Increased dose of metformin to 2 tablets (1000 mg) BID. Pt instructed to return to 500 mg BID if he experiences side effects.  -Decrease dose of Lantus to 8 units daily.  -Extensively discussed pathophysiology of diabetes, recommended lifestyle interventions, dietary effects on blood sugar control -Counseled on s/sx of and management of hypoglycemia -Next A1C anticipated 12/2021.   Written patient instructions provided.  Total time in face to face counseling 30 minutes.   Follow up Pharmacist Clinic Visit in 2-3 weeks.    Butch Penny, PharmD, Patsy [place], CPP Clinical Pharmacist [facility] & [facility] [phone]

## 2021-11-25 ENCOUNTER — Other Ambulatory Visit: Payer: Self-pay

## 2021-12-02 ENCOUNTER — Other Ambulatory Visit: Payer: Self-pay

## 2021-12-03 ENCOUNTER — Other Ambulatory Visit: Payer: Self-pay

## 2021-12-17 ENCOUNTER — Encounter: Payer: Self-pay | Admitting: Internal Medicine

## 2021-12-22 ENCOUNTER — Other Ambulatory Visit: Payer: Self-pay

## 2021-12-22 ENCOUNTER — Ambulatory Visit: Payer: Self-pay | Attending: Internal Medicine | Admitting: Pharmacist

## 2021-12-22 DIAGNOSIS — E1169 Type 2 diabetes mellitus with other specified complication: Secondary | ICD-10-CM

## 2021-12-22 DIAGNOSIS — E669 Obesity, unspecified: Secondary | ICD-10-CM

## 2021-12-22 NOTE — Progress Notes (Signed)
° ° °  S:    PCP: Dr. Laural Benes  No chief complaint on file.  Patient arrives in good spirits.  Presents for diabetes evaluation, education, and management Patient was referred and last seen by Primary Care Provider on 10/20/2021.  I saw him 11/20/2021 and increased his metformin to 1000 mg BID. We also decreased his insulin dose.   Today, he reports sweating at night since the increased metformin dose. Additionally, he tells me wakes up in the morning shaking. He checks his blood sugar during these episodes and endorses 80s-90s. Denies any levels <70.  Family/Social History:  Fhx: DM Tobacco: never smoker   Alcohol: denies   Insurance coverage/medication affordability: self pay   Medication adherence reported.   Current diabetes medications include: Lantus 8 units daily, metformin 1000 mg XR BID (takes 500 mg tablets BID) Current hyperlipidemia medications include: atorvastatin 10 mg daily  Patient reports hypoglycemic events. Checks and his blood sugars are in the 80s-90s. See above.  Patient reported dietary habits:  -Has eliminated sugar and carbohydrates from his diet   Patient-reported exercise habits: none   Reports feeling much better since his last PCP visit.  Patient denies nocturia (nighttime urination).  Patient denies neuropathy (nerve pain). Patient denies visual changes. Patient reports self foot exams.     O:  Physical Exam  ROS  Lab Results  Component Value Date   HGBA1C 14.0 (A) 10/20/2021   There were no vitals filed for this visit.  Lipid Panel     Component Value Date/Time   CHOL 189 06/30/2021 1020   TRIG 151 (H) 06/30/2021 1020   HDL 37 (L) 06/30/2021 1020   CHOLHDL 5.1 (H) 06/30/2021 1020   CHOLHDL 4.9 11/25/2010 0503   VLDL 24 11/25/2010 0503   LDLCALC 125 (H) 06/30/2021 1020   Home CBGs. No meter with him today.  Home fasting blood sugars: 83 - 135. Tells me most of the time his sugar is the 104-105.  2 hour post-meal/random blood  sugars: 120 - 130.   Clinical Atherosclerotic Cardiovascular Disease (ASCVD): No  The 10-year ASCVD risk score (Arnett DK, et al., 2019) is: 17.4%   Values used to calculate the score:     Age: 61 years     Sex: Male     Is Non-Hispanic African American: No     Diabetic: Yes     Tobacco smoker: No     Systolic Blood Pressure: 128 mmHg     Is BP treated: No     HDL Cholesterol: 37 mg/dL     Total Cholesterol: 189 mg/dL    A/P: Diabetes longstanding currently uncontrolled based on A1c, however, reported home blood sugars are at goal. Patient is able to verbalize appropriate hypoglycemia management plan. His hypoglycemia episodes are more relative than objective. Medication adherence appears appropriate. Will have him continue metformin and stop the Lantus for now.  -Continue metformin 500 mg XR, 2 tablets (1000 mg) BID. Pt instructed to return to 500 mg BID if he experiences continued hypoglycemia after stopping Lantus. -Discontinue Lantus. -Extensively discussed pathophysiology of diabetes, recommended lifestyle interventions, dietary effects on blood sugar control -Counseled on s/sx of and management of hypoglycemia -Next A1C anticipated 12/2021.   Written patient instructions provided.  Total time in face to face counseling 30 minutes.   Follow up Pharmacist Clinic Visit in 2-3 weeks.    Butch Penny, PharmD, P[place]y [place], CPP Clinical Pharmacist [facility] & [facility] [phone]

## 2021-12-24 ENCOUNTER — Other Ambulatory Visit: Payer: Self-pay

## 2022-01-21 ENCOUNTER — Other Ambulatory Visit: Payer: Self-pay

## 2022-01-21 ENCOUNTER — Ambulatory Visit: Payer: Self-pay | Attending: Internal Medicine | Admitting: Pharmacist

## 2022-01-21 DIAGNOSIS — E1169 Type 2 diabetes mellitus with other specified complication: Secondary | ICD-10-CM

## 2022-01-21 DIAGNOSIS — E669 Obesity, unspecified: Secondary | ICD-10-CM

## 2022-01-21 LAB — POCT GLYCOSYLATED HEMOGLOBIN (HGB A1C): HbA1c, POC (controlled diabetic range): 6.9 % (ref 0.0–7.0)

## 2022-01-21 NOTE — Progress Notes (Signed)
? ? ?  S:    ?PCP: Dr. Laural Benes ? ?No chief complaint on file. ? ?Patient arrives in good spirits.  Presents for diabetes evaluation, education, and management. Patient was referred and last seen by Primary Care Provider on 10/20/2021.  I saw him last month and stopped Lantus due to subjective hypoglycemia. We had him continue his metformin. ? ?Today, reports that he is feeling better since stopping Lantus. Not experiencing as much hypoglycemia but still gets shaky when his blood sugar is in the low 100s. To help with this, he decreased his metformin to one tablet once daily.   ? ?Family/Social History:  ?Fhx: DM ?Tobacco: never smoker   ?Alcohol: denies  ? ?Insurance coverage/medication affordability: self pay  ? ?Medication adherence reported, however, he takes differently than prescribed.   ?Current diabetes medications include:metformin 1000 mg XR BID - only takes 1 tablet once daily  ?Current hyperlipidemia medications include: atorvastatin 10 mg daily ? ?Patient denies hypoglycemic events.  ? ?Patient reported dietary habits:  ?-Has eliminated sugar and carbohydrates from his diet  ? ?Patient-reported exercise habits: none ?  ?Reports feeling much better since his last PCP visit.  ?Patient denies nocturia (nighttime urination).  ?Patient denies neuropathy (nerve pain). ?Patient denies visual changes. ?Patient reports self foot exams.  ?  ? ?O:  ?Physical Exam ? ?ROS ? ?Lab Results  ?Component Value Date  ? HGBA1C 6.9 01/21/2022  ? ?There were no vitals filed for this visit. ? ?Lipid Panel  ?   ?Component Value Date/Time  ? CHOL 189 06/30/2021 1020  ? TRIG 151 (H) 06/30/2021 1020  ? HDL 37 (L) 06/30/2021 1020  ? CHOLHDL 5.1 (H) 06/30/2021 1020  ? CHOLHDL 4.9 11/25/2010 0503  ? VLDL 24 11/25/2010 0503  ? LDLCALC 125 (H) 06/30/2021 1020  ? ?Home CBGs. No meter with him today.  ?Home fasting blood sugars: 100 - 120 ?2 hour post-meal/random blood sugars: 120 - 130.  ? ?Clinical Atherosclerotic Cardiovascular Disease  (ASCVD): No  ?The 10-year ASCVD risk score (Arnett DK, et al., 2019) is: 17.4% ?  Values used to calculate the score: ?    Age: 61 years ?    Sex: Male ?    Is Non-Hispanic African American: No ?    Diabetic: Yes ?    Tobacco smoker: No ?    Systolic Blood Pressure: 128 mmHg ?    Is BP treated: No ?    HDL Cholesterol: 37 mg/dL ?    Total Cholesterol: 189 mg/dL  ? ? ?A/P: ?Diabetes longstanding currently controlled based on A1c. Patient is able to verbalize appropriate hypoglycemia management plan. His hypoglycemia episodes are more relative than objective. Medication adherence appears appropriate. Will have him continue metformin once daily. Could consider management with lifestyle only in the future, however, I encouraged him to stay on the metformin for now.   ?-Continue metformin 500 mg XR daily.  ?-Extensively discussed pathophysiology of diabetes, recommended lifestyle interventions, dietary effects on blood sugar control ?-Counseled on s/sx of and management of hypoglycemia ?-Next A1C anticipated 04/2022.  ? ?Written patient instructions provided.  Total time in face to face counseling 30 minutes.   ?Follow up PCP Clinic Visit in 1 month.  ? ?Butch Penny, PharmD, BCACP, CPP ?Clinical Pharmacist ?[facility] ?[phone] ? ? ?

## 2022-02-25 ENCOUNTER — Ambulatory Visit: Payer: Self-pay | Admitting: Internal Medicine

## 2022-03-04 ENCOUNTER — Other Ambulatory Visit: Payer: Self-pay

## 2022-03-04 ENCOUNTER — Encounter: Payer: Self-pay | Admitting: Internal Medicine

## 2022-03-04 ENCOUNTER — Ambulatory Visit: Payer: Self-pay | Attending: Internal Medicine | Admitting: Internal Medicine

## 2022-03-04 VITALS — BP 120/78 | HR 70 | Temp 98.1°F | Resp 16 | Wt 202.2 lb

## 2022-03-04 DIAGNOSIS — E785 Hyperlipidemia, unspecified: Secondary | ICD-10-CM

## 2022-03-04 DIAGNOSIS — E669 Obesity, unspecified: Secondary | ICD-10-CM

## 2022-03-04 DIAGNOSIS — R5383 Other fatigue: Secondary | ICD-10-CM

## 2022-03-04 DIAGNOSIS — E1169 Type 2 diabetes mellitus with other specified complication: Secondary | ICD-10-CM

## 2022-03-04 DIAGNOSIS — Z23 Encounter for immunization: Secondary | ICD-10-CM

## 2022-03-04 DIAGNOSIS — Z1211 Encounter for screening for malignant neoplasm of colon: Secondary | ICD-10-CM

## 2022-03-04 DIAGNOSIS — R252 Cramp and spasm: Secondary | ICD-10-CM

## 2022-03-04 LAB — GLUCOSE, POCT (MANUAL RESULT ENTRY): POC Glucose: 144 mg/dl — AB (ref 70–99)

## 2022-03-04 MED ORDER — METFORMIN HCL ER 500 MG PO TB24
500.0000 mg | ORAL_TABLET | Freq: Two times a day (BID) | ORAL | 3 refills | Status: AC
  Filled 2022-03-04: qty 60, 30d supply, fill #0
  Filled 2022-10-13: qty 60, 30d supply, fill #1
  Filled 2022-11-09: qty 60, 30d supply, fill #2
  Filled 2022-12-08: qty 60, 30d supply, fill #3
  Filled 2023-01-10: qty 60, 30d supply, fill #4

## 2022-03-04 MED ORDER — ROSUVASTATIN CALCIUM 10 MG PO TABS
10.0000 mg | ORAL_TABLET | Freq: Every day | ORAL | 6 refills | Status: DC
  Filled 2022-03-04: qty 30, 30d supply, fill #0
  Filled 2022-07-08: qty 30, 30d supply, fill #1
  Filled 2022-08-05: qty 30, 30d supply, fill #2
  Filled 2022-09-03: qty 30, 30d supply, fill #3
  Filled 2022-10-01 – 2022-10-04 (×2): qty 30, 30d supply, fill #4
  Filled 2022-11-09: qty 30, 30d supply, fill #5
  Filled 2022-12-08: qty 30, 30d supply, fill #6

## 2022-03-04 NOTE — Progress Notes (Signed)
? ? ?Patient ID: Allen Lowery, male    DOB: May 03, 1961  MRN: [id number] ? ?CC: Follow-up and Diabetes ? ? ?Subjective: ?Allen Lowery is a 61 y.o. male who presents for chronic ds management ?His concerns today include:  ?Patient with history of DM type II, Bell's palsy, HL, chronic radicular LBP (hx lumbar decompression surgery 04/2021).  ? ?DM: ?Results for orders placed or performed in visit on 03/04/22  ?POCT glucose (manual entry)  ?Result Value Ref Range  ? POC Glucose 144 (A) 70 - 99 mg/dl  ? ?Lab Results  ?Component Value Date  ? HGBA1C 6.9 01/21/2022  ?On last visit with me 10/2021 his A1C was 14.  Pt started on Metformin 500 mg and Lantus 10 units QHS.  Since then he has done well.  He has f/u with clinical pharmacist; BS good; was able to be weaned of insulin.  Last A1C was 6.9.  BS here is 144, this is fasting ?Currently on Metformin 500 mg BID ?Stopped checking BS last wk; prior to that he was checking once a day.  Gives range 88-100 ?Exercise: walking 10-20 mins daily ?Doing well with eating habits.  Down 11 lbs since 06/2022 ?Reports having had eye exam at [facility] Dr. Massie Kluver 11/21/2021.  Has copy of written rxn with date on it.  Reports he did have some changes of retinopathy and has to f/u in 6 mths ?Not taking Gabapentin.  No numbness in feet ?Taking Lipitor.  Reports cramps in calf BL at nights.  Occurs almost every night ? ?C/o night sweats and feeling tired about once a wk.  Gets in 6-7 hrs of sleep at nights, wakes feeling refresh, does not snore loud. ?Patient Active Problem List  ? Diagnosis Date Noted  ? History of lumbar laminectomy for spinal cord decompression 05/06/2021  ? Health care maintenance 05/01/2020  ? T2DM (type 2 diabetes mellitus) (HCC) 04/01/2020  ? Blurred vision, right eye 04/01/2020  ? Ear abrasion 04/01/2020  ? Bell's palsy 03/15/2020  ?  ? ?Current Outpatient Medications on File Prior to Visit  ?Medication Sig Dispense Refill  ? Insulin Pen  Needle 31G X 8 MM MISC use as directed 100 each 6  ? ?No current facility-administered medications on file prior to visit.  ? ? ?Allergies  ?Allergen Reactions  ? Aspirin   ?  Gi upset  ? Lipitor [Atorvastatin] Other (See Comments)  ?  Cramps in calf at nights  ? ? ?Social History  ? ?Socioeconomic History  ? Marital status: Single  ?  Spouse name: Not on file  ? Number of children: Not on file  ? Years of education: Not on file  ? Highest education level: Not on file  ?Occupational History  ? Not on file  ?Tobacco Use  ? Smoking status: Never  ? Smokeless tobacco: Never  ?Vaping Use  ? Vaping Use: Never used  ?Substance and Sexual Activity  ? Alcohol use: No  ? Drug use: No  ? Sexual activity: Not on file  ?Other Topics Concern  ? Not on file  ?Social History Narrative  ? ** Merged History Encounter **  ?    ? ?Social Determinants of Health  ? ?Financial Resource Strain: Not on file  ?Food Insecurity: Not on file  ?Transportation Needs: Not on file  ?Physical Activity: Not on file  ?Stress: Not on file  ?Social Connections: Not on file  ?Intimate Partner Violence: Not on file  ? ? ?Family History  ?Problem  Relation Age of Onset  ? Diabetes Mother   ? Heart disease Neg Hx   ? Hypertension Neg Hx   ? Cancer Neg Hx   ? ? ?Past Surgical History:  ?Procedure Laterality Date  ? KIDNEY STONE SURGERY    ? kidney stones    ? LUMBAR LAMINECTOMY N/A 04/27/2021  ? Procedure: LUMBAR TWO-THREE DECOMPRESSION, RIGHT LUMBAR TWO-THREE MICRODISCECTOMY;  Surgeon: Eldred MangesYates, Mark C, MD;  Location: [facility] OR;  Service: Orthopedics;  Laterality: N/A;  ? TOOTH EXTRACTION    ? ? ?ROS: ?Review of Systems ?Negative except as stated above ? ?PHYSICAL EXAM: ?BP 120/78   Pulse 70   Temp 98.1 ?F (36.7 ?C) (Oral)   Resp 16   Wt 202 lb 3.2 oz (91.7 kg)   SpO2 94%   BMI 35.82 kg/m?   ?Wt Readings from Last 3 Encounters:  ?03/04/22 202 lb 3.2 oz (91.7 kg)  ?10/20/21 204 lb (92.5 kg)  ?06/30/21 213 lb (96.6 kg)  ? ? ?Physical Exam ? ? ?General  appearance - alert, well appearing, older Hispanic male who looks younger than his stated age and in no distress ?Mental status - normal mood, behavior, speech, dress, motor activity, and thought processes ?Eyes - pupils equal and reactive, extraocular eye movements intact ?Mouth - mucous membranes moist, pharynx normal without lesions ?Neck - supple, no significant adenopathy ?Chest - clear to auscultation, no wheezes, rales or rhonchi, symmetric air entry ?Heart - normal rate, regular rhythm, normal S1, S2, no murmurs, rubs, clicks or gallops ?Extremities - peripheral pulses normal, no pedal edema, no clubbing or cyanosis ?Diabetic Foot Exam - Simple   ?Simple Foot Form ?Visual Inspection ?See comments: Yes ?Sensation Testing ?Intact to touch and monofilament testing bilaterally: Yes ?Pulse Check ?Posterior Tibialis and Dorsalis pulse intact bilaterally: Yes ?Comments ?Toe nail on big toes chipped and discolored ?  ? ? ? ?  Latest Ref Rng & Units 10/20/2021  ? 10:00 AM 06/30/2021  ? 10:20 AM 04/24/2021  ?  1:14 PM  ?CMP  ?Glucose 70 - 99 mg/dL 119327    147112    ?BUN 6 - 24 mg/dL 19    14    ?Creatinine 0.76 - 1.27 mg/dL 8.290.90    5.620.82    ?Sodium 134 - 144 mmol/L 135    139    ?Potassium 3.5 - 5.2 mmol/L 4.8    4.2    ?Chloride 96 - 106 mmol/L 99    105    ?CO2 20 - 29 mmol/L 24    28    ?Calcium 8.7 - 10.2 mg/dL 9.3    9.4    ?Total Protein 6.0 - 8.5 g/dL 6.5   6.9     ?Total Bilirubin 0.0 - 1.2 mg/dL 0.4   0.2     ?Alkaline Phos 44 - 121 IU/L 86   77     ?AST 0 - 40 IU/L 20   20     ?ALT 0 - 44 IU/L 26   22     ? ?Lipid Panel  ?   ?Component Value Date/Time  ? CHOL 189 06/30/2021 1020  ? TRIG 151 (H) 06/30/2021 1020  ? HDL 37 (L) 06/30/2021 1020  ? CHOLHDL 5.1 (H) 06/30/2021 1020  ? CHOLHDL 4.9 11/25/2010 0503  ? VLDL 24 11/25/2010 0503  ? LDLCALC 125 (H) 06/30/2021 1020  ? ? ?CBC ?   ?Component Value Date/Time  ? WBC 8.3 10/20/2021 1000  ? WBC 9.2 04/24/2021 1314  ?  RBC 5.13 10/20/2021 1000  ? RBC 4.93 04/24/2021 1314   ? HGB 15.3 10/20/2021 1000  ? HCT 46.6 10/20/2021 1000  ? PLT 269 10/20/2021 1000  ? MCV 91 10/20/2021 1000  ? MCH 29.8 10/20/2021 1000  ? MCH 30.0 04/24/2021 1314  ? MCHC 32.8 10/20/2021 1000  ? MCHC 32.8 04/24/2021 1314  ? RDW 12.9 10/20/2021 1000  ? LYMPHSABS 2.2 03/14/2020 1900  ? MONOABS 0.6 03/14/2020 1900  ? EOSABS 0.3 03/14/2020 1900  ? BASOSABS 0.1 03/14/2020 1900  ? ? ?ASSESSMENT AND PLAN: ?1. Type 2 diabetes mellitus with obesity (HCC) ?Reported blood sugars are good. ?Advised patient to check blood sugars daily or at least 4 times a week before breakfast to make sure that his blood sugars are staying at goal.  Continue metformin.  Commended him on healthy eating habits and regular exercise.  Encouraged him to continue ?- POCT glucose (manual entry) ?- metFORMIN (GLUCOPHAGE-XR) 500 MG 24 hr tablet; Take 1 tablet (500 mg total) by mouth 2 times daily at 12 noon and 4 pm.  Dispense: 120 tablet; Refill: 3 ? ?2. Hyperlipidemia associated with type 2 diabetes mellitus (HCC) ?Change Lipitor to Crestor given complaint of cramps in the calf at night.  He will let me know if the cramps continue ?- rosuvastatin (CRESTOR) 10 MG tablet; Take 1 tablet (10 mg total) by mouth daily. Stop Atorvastatin  Dispense: 30 tablet; Refill: 6 ? ?3. Other fatigue ?Recommend that he tries to get in at least 7 to 8 hours of sleep at nights.  History does not suggest sleep apnea. ? ?4. Screening for colon cancer ?Discussed need for colon cancer screening.  He is agreeable to fit test ?- Fecal occult blood, imunochemical([facility]) ? ?5. Need for Tdap vaccination ?Given today. ?- Tdap vaccine greater than or equal to 7yo IM ? ?6. Muscle cramps ?See #2 above. ? ? ?[employer] Language interpreter used during this encounter. #Alejandra [id number] ? ?Patient was given the opportunity to ask questions.  Patient verbalized understanding of the plan and was able to repeat key elements of the plan.  ? ?This documentation was completed using  Paediatric nurse.  Any transcriptional errors are unintentional. ? ?Orders Placed This Encounter  ?Procedures  ? Fecal occult blood, imunochemical([facility])  ? Tdap vaccine greater than or equal

## 2022-03-04 NOTE — Progress Notes (Signed)
Patient c\o having night sweats. Patient said it is only on his left side. Patient said that he also fees very tired and is very concern. ?

## 2022-07-08 ENCOUNTER — Other Ambulatory Visit: Payer: Self-pay

## 2022-08-05 ENCOUNTER — Other Ambulatory Visit: Payer: Self-pay

## 2022-09-03 ENCOUNTER — Other Ambulatory Visit: Payer: Self-pay

## 2022-10-01 ENCOUNTER — Other Ambulatory Visit: Payer: Self-pay

## 2022-10-03 ENCOUNTER — Ambulatory Visit (HOSPITAL_COMMUNITY)
Admission: EM | Admit: 2022-10-03 | Discharge: 2022-10-03 | Disposition: A | Discharge status: Home / Self Care | Payer: Self-pay | Attending: Internal Medicine | Admitting: Internal Medicine

## 2022-10-03 ENCOUNTER — Other Ambulatory Visit: Payer: Self-pay

## 2022-10-03 ENCOUNTER — Encounter (HOSPITAL_COMMUNITY): Payer: Self-pay | Admitting: *Deleted

## 2022-10-03 DIAGNOSIS — N3001 Acute cystitis with hematuria: Secondary | ICD-10-CM | POA: Insufficient documentation

## 2022-10-03 LAB — POCT URINALYSIS DIPSTICK, ED / UC
Bilirubin Urine: NEGATIVE
Glucose, UA: 100 mg/dL — AB
Nitrite: POSITIVE — AB
Protein, ur: 100 mg/dL — AB
Specific Gravity, Urine: 1.025 (ref 1.005–1.030)
Urobilinogen, UA: 0.2 mg/dL (ref 0.0–1.0)
pH: 6.5 (ref 5.0–8.0)

## 2022-10-03 MED ORDER — CEPHALEXIN 500 MG PO CAPS: 500.0000 mg | ORAL_CAPSULE | Freq: Two times a day (BID) | ORAL | 0 refills | Status: AC

## 2022-10-03 NOTE — ED Provider Notes (Signed)
[facility]    CSN: [id number] Arrival date & time: 10/03/22  1106      History   Chief Complaint Chief Complaint  Patient presents with   Dysuria   Hematuria    HPI Allen Lowery is a 61 y.o. male was to the urgent care with 6-day history of dysuria and blood in urine this morning.  Patient says dysuria started insidiously and has been persistent over the past several days.  This morning he noticed some blood in the urine as a visit to the urgent care to be reevaluated.  He denies any fever or chills.  No flank pain.  No nausea or vomiting.  Oral intake is fair.  Patient is diabetic with the last blood sugar of 180 checked last week.  No dizziness, near syncope or syncopal episode.  Patient endorses normal prostate evaluation recently.   HPI  Past Medical History:  Diagnosis Date   Diabetes mellitus without complication (HCC)    History of kidney stones     Patient Active Problem List   Diagnosis Date Noted   History of lumbar laminectomy for spinal cord decompression 05/06/2021   Health care maintenance 05/01/2020   T2DM (type 2 diabetes mellitus) (HCC) 04/01/2020   Blurred vision, right eye 04/01/2020   Ear abrasion 04/01/2020   Bell's palsy 03/15/2020    Past Surgical History:  Procedure Laterality Date   KIDNEY STONE SURGERY     kidney stones     LUMBAR LAMINECTOMY N/A 04/27/2021   Procedure: LUMBAR TWO-THREE DECOMPRESSION, RIGHT LUMBAR TWO-THREE MICRODISCECTOMY;  Surgeon: Eldred Manges, MD;  Location: [facility] OR;  Service: Orthopedics;  Laterality: N/A;   TOOTH EXTRACTION         Home Medications    Prior to Admission medications   Medication Sig Start Date End Date Taking? Authorizing Provider  cephALEXin (KEFLEX) 500 MG capsule Take 1 capsule (500 mg total) by mouth 2 (two) times daily for 5 days. 10/03/22 10/08/22 Yes Trayven Lumadue, Britta Mccreedy, MD  Insulin Pen Needle 31G X 8 MM MISC use as directed 10/20/21   Marcine Matar, MD  metFORMIN  (GLUCOPHAGE-XR) 500 MG 24 hr tablet Take 1 tablet (500 mg total) by mouth 2 times daily at 12 noon and 4 pm. 03/04/22   Marcine Matar, MD  rosuvastatin (CRESTOR) 10 MG tablet Take 1 tablet (10 mg total) by mouth daily. Stop Atorvastatin 03/04/22   Marcine Matar, MD    Family History Family History  Problem Relation Age of Onset   Diabetes Mother    Heart disease Neg Hx    Hypertension Neg Hx    Cancer Neg Hx     Social History Social History   Tobacco Use   Smoking status: Never   Smokeless tobacco: Never  Vaping Use   Vaping Use: Never used  Substance Use Topics   Alcohol use: No   Drug use: No     Allergies   Aspirin and Lipitor [atorvastatin]   Review of Systems Review of Systems  Respiratory: Negative.    Cardiovascular: Negative.   Gastrointestinal:  Negative for abdominal pain, constipation, diarrhea, nausea and vomiting.  Genitourinary:  Positive for dysuria. Negative for flank pain, frequency, penile discharge, penile pain, scrotal swelling and urgency.  Musculoskeletal: Negative.      Physical Exam Triage Vital Signs ED Triage Vitals  Enc Vitals Group     BP 10/03/22 1352 119/74     Pulse Rate 10/03/22 1352 71  Resp 10/03/22 1352 16     Temp 10/03/22 1352 98.1 F (36.7 C)     Temp src --      SpO2 10/03/22 1352 95 %     Weight --      Height --      Head Circumference --      Peak Flow --      Pain Score 10/03/22 1349 3     Pain Loc --      Pain Edu? --      Excl. in GC? --    No data found.  Updated Vital Signs BP 119/74   Pulse 71   Temp 98.1 F (36.7 C)   Resp 16   SpO2 95%   Visual Acuity Right Eye Distance:   Left Eye Distance:   Bilateral Distance:    Right Eye Near:   Left Eye Near:    Bilateral Near:     Physical Exam Vitals and nursing note reviewed.  Constitutional:      General: He is not in acute distress.    Appearance: Normal appearance. He is not ill-appearing.  Cardiovascular:     Rate and Rhythm:  Normal rate and regular rhythm.  Pulmonary:     Effort: Pulmonary effort is normal.     Breath sounds: Normal breath sounds.  Abdominal:     General: Bowel sounds are normal.     Palpations: Abdomen is soft.  Neurological:     Mental Status: He is alert.      UC Treatments / Results  Labs (all labs ordered are listed, but only abnormal results are displayed) Labs Reviewed  POCT URINALYSIS DIPSTICK, ED / UC - Abnormal; Notable for the following components:      Result Value   Glucose, UA 100 (*)    Ketones, ur TRACE (*)    Hgb urine dipstick LARGE (*)    Protein, ur 100 (*)    Nitrite POSITIVE (*)    Leukocytes,Ua SMALL (*)    All other components within normal limits  URINE CULTURE    EKG   Radiology No results found.  Procedures Procedures (including critical care time)  Medications Ordered in UC Medications - No data to display  Initial Impression / Assessment and Plan / UC Course  I have reviewed the triage vital signs and the nursing notes.  Pertinent labs & imaging results that were available during my care of the patient were reviewed by me and considered in my medical decision making (see chart for details).     1.  Acute cystitis with hematuria: Point-of-care urinalysis is positive for hemoglobin, protein and leukocyte esterase Keflex 500 mg twice daily for 5 days Increase oral fluid intake Urine cultures have been sent Will call patient with recommendations if labs are abnormal Return precautions given. Final Clinical Impressions(s) / UC Diagnoses   Final diagnoses:  Acute cystitis with hematuria     Discharge Instructions      Increase oral fluid intake Please take medications as directed We have sent urine cultures-we will call you if urine cultures require as to change antibiotics Return to urgent care if you have fever, persistent nausea, vomiting or severe abdominal pain.   ED Prescriptions     Medication Sig Dispense Auth.  Provider   cephALEXin (KEFLEX) 500 MG capsule Take 1 capsule (500 mg total) by mouth 2 (two) times daily for 5 days. 10 capsule Johnny Latu, Britta Mccreedy, MD      PDMP not reviewed  this encounter.   Merrilee Jansky, MD 10/03/22 [phone]

## 2022-10-03 NOTE — ED Triage Notes (Signed)
Pt reports dysuria for 5-6 days and this morning he had blood in his urine . Pt also has a burning sensation in his ABD. Pt reports he has had a kidney stone in the past.

## 2022-10-03 NOTE — Discharge Instructions (Addendum)
Increase oral fluid intake Please take medications as directed We have sent urine cultures-we will call you if urine cultures require as to change antibiotics Return to urgent care if you have fever, persistent nausea, vomiting or severe abdominal pain.

## 2022-10-04 ENCOUNTER — Other Ambulatory Visit: Payer: Self-pay

## 2022-10-05 ENCOUNTER — Other Ambulatory Visit: Payer: Self-pay

## 2022-10-05 LAB — URINE CULTURE: Culture: >=100000 — AB

## 2022-10-05 MED ORDER — CEPHALEXIN 500 MG PO CAPS
500.0000 mg | ORAL_CAPSULE | Freq: Two times a day (BID) | ORAL | 0 refills | Status: AC
  Filled 2022-10-05: qty 10, 5d supply, fill #0

## 2022-10-13 ENCOUNTER — Other Ambulatory Visit: Payer: Self-pay

## 2022-10-14 ENCOUNTER — Other Ambulatory Visit: Payer: Self-pay

## 2022-11-09 ENCOUNTER — Other Ambulatory Visit: Payer: Self-pay

## 2022-12-08 ENCOUNTER — Other Ambulatory Visit: Payer: Self-pay

## 2023-01-10 ENCOUNTER — Other Ambulatory Visit: Payer: Self-pay | Admitting: Internal Medicine

## 2023-01-10 ENCOUNTER — Other Ambulatory Visit: Payer: Self-pay

## 2023-01-10 DIAGNOSIS — E1169 Type 2 diabetes mellitus with other specified complication: Secondary | ICD-10-CM

## 2023-01-10 MED ORDER — ROSUVASTATIN CALCIUM 10 MG PO TABS
10.0000 mg | ORAL_TABLET | Freq: Every day | ORAL | 0 refills | Status: AC
  Filled 2023-01-10: qty 30, 30d supply, fill #0

## 2023-01-11 ENCOUNTER — Other Ambulatory Visit: Payer: Self-pay

## 2023-01-12 ENCOUNTER — Other Ambulatory Visit: Payer: Self-pay

## 2023-02-09 ENCOUNTER — Other Ambulatory Visit: Payer: Self-pay | Admitting: Internal Medicine

## 2023-02-09 ENCOUNTER — Other Ambulatory Visit: Payer: Self-pay

## 2023-02-09 DIAGNOSIS — E1169 Type 2 diabetes mellitus with other specified complication: Secondary | ICD-10-CM

## 2023-02-10 ENCOUNTER — Other Ambulatory Visit: Payer: Self-pay

## 2023-02-11 ENCOUNTER — Other Ambulatory Visit: Payer: Self-pay

## 2023-08-05 ENCOUNTER — Other Ambulatory Visit: Payer: Self-pay | Admitting: Internal Medicine

## 2023-08-05 DIAGNOSIS — Z1212 Encounter for screening for malignant neoplasm of rectum: Secondary | ICD-10-CM

## 2023-08-05 DIAGNOSIS — Z1211 Encounter for screening for malignant neoplasm of colon: Secondary | ICD-10-CM
